# Patient Record
Sex: Male | Born: 1935 | Race: White | Hispanic: No | Marital: Single | Smoking: Former smoker
Health system: Southern US, Community
[De-identification: ages and names within clinical notes are randomized; demographics above are authoritative.]

## PROBLEM LIST (undated history)

## (undated) DIAGNOSIS — E119 Type 2 diabetes mellitus without complications: Secondary | ICD-10-CM

## (undated) DIAGNOSIS — I10 Essential (primary) hypertension: Secondary | ICD-10-CM

## (undated) DIAGNOSIS — M1990 Unspecified osteoarthritis, unspecified site: Secondary | ICD-10-CM

## (undated) HISTORY — PX: COLONOSCOPY: SHX174

## (undated) HISTORY — PX: TONSILLECTOMY: SUR1361

---

## 2005-05-15 ENCOUNTER — Ambulatory Visit (HOSPITAL_COMMUNITY): Admission: RE | Admit: 2005-05-15 | Discharge: 2005-05-15 | Payer: Self-pay | Admitting: Gastroenterology

## 2011-10-23 DIAGNOSIS — E119 Type 2 diabetes mellitus without complications: Secondary | ICD-10-CM | POA: Diagnosis not present

## 2011-12-01 DIAGNOSIS — L03319 Cellulitis of trunk, unspecified: Secondary | ICD-10-CM | POA: Diagnosis not present

## 2011-12-01 DIAGNOSIS — L02219 Cutaneous abscess of trunk, unspecified: Secondary | ICD-10-CM | POA: Diagnosis not present

## 2011-12-12 DIAGNOSIS — E119 Type 2 diabetes mellitus without complications: Secondary | ICD-10-CM | POA: Diagnosis not present

## 2011-12-12 DIAGNOSIS — E7800 Pure hypercholesterolemia, unspecified: Secondary | ICD-10-CM | POA: Diagnosis not present

## 2011-12-12 DIAGNOSIS — Z79899 Other long term (current) drug therapy: Secondary | ICD-10-CM | POA: Diagnosis not present

## 2011-12-14 DIAGNOSIS — E785 Hyperlipidemia, unspecified: Secondary | ICD-10-CM | POA: Diagnosis not present

## 2011-12-14 DIAGNOSIS — M25569 Pain in unspecified knee: Secondary | ICD-10-CM | POA: Diagnosis not present

## 2011-12-14 DIAGNOSIS — E119 Type 2 diabetes mellitus without complications: Secondary | ICD-10-CM | POA: Diagnosis not present

## 2011-12-14 DIAGNOSIS — I10 Essential (primary) hypertension: Secondary | ICD-10-CM | POA: Diagnosis not present

## 2012-02-27 DIAGNOSIS — E119 Type 2 diabetes mellitus without complications: Secondary | ICD-10-CM | POA: Diagnosis not present

## 2012-03-12 DIAGNOSIS — H43819 Vitreous degeneration, unspecified eye: Secondary | ICD-10-CM | POA: Diagnosis not present

## 2012-03-28 DIAGNOSIS — Z23 Encounter for immunization: Secondary | ICD-10-CM | POA: Diagnosis not present

## 2012-06-10 DIAGNOSIS — Z125 Encounter for screening for malignant neoplasm of prostate: Secondary | ICD-10-CM | POA: Diagnosis not present

## 2012-06-10 DIAGNOSIS — E119 Type 2 diabetes mellitus without complications: Secondary | ICD-10-CM | POA: Diagnosis not present

## 2012-06-10 DIAGNOSIS — E785 Hyperlipidemia, unspecified: Secondary | ICD-10-CM | POA: Diagnosis not present

## 2012-06-10 DIAGNOSIS — Z79899 Other long term (current) drug therapy: Secondary | ICD-10-CM | POA: Diagnosis not present

## 2012-06-13 DIAGNOSIS — I10 Essential (primary) hypertension: Secondary | ICD-10-CM | POA: Diagnosis not present

## 2012-06-13 DIAGNOSIS — E785 Hyperlipidemia, unspecified: Secondary | ICD-10-CM | POA: Diagnosis not present

## 2012-06-13 DIAGNOSIS — Z8249 Family history of ischemic heart disease and other diseases of the circulatory system: Secondary | ICD-10-CM | POA: Diagnosis not present

## 2012-06-13 DIAGNOSIS — IMO0001 Reserved for inherently not codable concepts without codable children: Secondary | ICD-10-CM | POA: Diagnosis not present

## 2012-12-10 DIAGNOSIS — E1149 Type 2 diabetes mellitus with other diabetic neurological complication: Secondary | ICD-10-CM | POA: Diagnosis not present

## 2012-12-10 DIAGNOSIS — Z79899 Other long term (current) drug therapy: Secondary | ICD-10-CM | POA: Diagnosis not present

## 2012-12-10 DIAGNOSIS — I10 Essential (primary) hypertension: Secondary | ICD-10-CM | POA: Diagnosis not present

## 2012-12-10 DIAGNOSIS — E785 Hyperlipidemia, unspecified: Secondary | ICD-10-CM | POA: Diagnosis not present

## 2012-12-12 DIAGNOSIS — E785 Hyperlipidemia, unspecified: Secondary | ICD-10-CM | POA: Diagnosis not present

## 2012-12-12 DIAGNOSIS — I10 Essential (primary) hypertension: Secondary | ICD-10-CM | POA: Diagnosis not present

## 2012-12-12 DIAGNOSIS — E1149 Type 2 diabetes mellitus with other diabetic neurological complication: Secondary | ICD-10-CM | POA: Diagnosis not present

## 2012-12-19 DIAGNOSIS — E119 Type 2 diabetes mellitus without complications: Secondary | ICD-10-CM | POA: Diagnosis not present

## 2013-03-17 DIAGNOSIS — Z23 Encounter for immunization: Secondary | ICD-10-CM | POA: Diagnosis not present

## 2013-06-11 DIAGNOSIS — E1149 Type 2 diabetes mellitus with other diabetic neurological complication: Secondary | ICD-10-CM | POA: Diagnosis not present

## 2013-06-11 DIAGNOSIS — Z79899 Other long term (current) drug therapy: Secondary | ICD-10-CM | POA: Diagnosis not present

## 2013-06-11 DIAGNOSIS — R209 Unspecified disturbances of skin sensation: Secondary | ICD-10-CM | POA: Diagnosis not present

## 2013-06-11 DIAGNOSIS — G63 Polyneuropathy in diseases classified elsewhere: Secondary | ICD-10-CM | POA: Diagnosis not present

## 2013-06-11 DIAGNOSIS — E785 Hyperlipidemia, unspecified: Secondary | ICD-10-CM | POA: Diagnosis not present

## 2013-06-13 DIAGNOSIS — I10 Essential (primary) hypertension: Secondary | ICD-10-CM | POA: Diagnosis not present

## 2013-06-13 DIAGNOSIS — E785 Hyperlipidemia, unspecified: Secondary | ICD-10-CM | POA: Diagnosis not present

## 2013-06-13 DIAGNOSIS — E1149 Type 2 diabetes mellitus with other diabetic neurological complication: Secondary | ICD-10-CM | POA: Diagnosis not present

## 2013-11-21 DIAGNOSIS — S40019A Contusion of unspecified shoulder, initial encounter: Secondary | ICD-10-CM | POA: Diagnosis not present

## 2013-11-25 DIAGNOSIS — E119 Type 2 diabetes mellitus without complications: Secondary | ICD-10-CM | POA: Diagnosis not present

## 2013-12-10 DIAGNOSIS — E785 Hyperlipidemia, unspecified: Secondary | ICD-10-CM | POA: Diagnosis not present

## 2013-12-10 DIAGNOSIS — E1149 Type 2 diabetes mellitus with other diabetic neurological complication: Secondary | ICD-10-CM | POA: Diagnosis not present

## 2013-12-12 DIAGNOSIS — E1142 Type 2 diabetes mellitus with diabetic polyneuropathy: Secondary | ICD-10-CM | POA: Diagnosis not present

## 2013-12-12 DIAGNOSIS — E785 Hyperlipidemia, unspecified: Secondary | ICD-10-CM | POA: Diagnosis not present

## 2013-12-12 DIAGNOSIS — M25519 Pain in unspecified shoulder: Secondary | ICD-10-CM | POA: Diagnosis not present

## 2013-12-12 DIAGNOSIS — Z79899 Other long term (current) drug therapy: Secondary | ICD-10-CM | POA: Diagnosis not present

## 2013-12-12 DIAGNOSIS — E1149 Type 2 diabetes mellitus with other diabetic neurological complication: Secondary | ICD-10-CM | POA: Diagnosis not present

## 2013-12-12 DIAGNOSIS — I10 Essential (primary) hypertension: Secondary | ICD-10-CM | POA: Diagnosis not present

## 2014-03-28 DIAGNOSIS — Z23 Encounter for immunization: Secondary | ICD-10-CM | POA: Diagnosis not present

## 2014-06-10 DIAGNOSIS — E785 Hyperlipidemia, unspecified: Secondary | ICD-10-CM | POA: Diagnosis not present

## 2014-06-10 DIAGNOSIS — Z79899 Other long term (current) drug therapy: Secondary | ICD-10-CM | POA: Diagnosis not present

## 2014-06-10 DIAGNOSIS — E1142 Type 2 diabetes mellitus with diabetic polyneuropathy: Secondary | ICD-10-CM | POA: Diagnosis not present

## 2014-06-12 DIAGNOSIS — E785 Hyperlipidemia, unspecified: Secondary | ICD-10-CM | POA: Diagnosis not present

## 2014-06-12 DIAGNOSIS — E1140 Type 2 diabetes mellitus with diabetic neuropathy, unspecified: Secondary | ICD-10-CM | POA: Diagnosis not present

## 2014-06-12 DIAGNOSIS — M25569 Pain in unspecified knee: Secondary | ICD-10-CM | POA: Diagnosis not present

## 2014-06-12 DIAGNOSIS — I10 Essential (primary) hypertension: Secondary | ICD-10-CM | POA: Diagnosis not present

## 2014-08-31 DIAGNOSIS — M1712 Unilateral primary osteoarthritis, left knee: Secondary | ICD-10-CM | POA: Diagnosis not present

## 2014-08-31 DIAGNOSIS — M1612 Unilateral primary osteoarthritis, left hip: Secondary | ICD-10-CM | POA: Diagnosis not present

## 2014-09-28 DIAGNOSIS — M1712 Unilateral primary osteoarthritis, left knee: Secondary | ICD-10-CM | POA: Diagnosis not present

## 2014-09-28 DIAGNOSIS — M1612 Unilateral primary osteoarthritis, left hip: Secondary | ICD-10-CM | POA: Diagnosis not present

## 2014-10-02 DIAGNOSIS — M1612 Unilateral primary osteoarthritis, left hip: Secondary | ICD-10-CM | POA: Diagnosis not present

## 2014-10-13 DIAGNOSIS — M1612 Unilateral primary osteoarthritis, left hip: Secondary | ICD-10-CM | POA: Diagnosis not present

## 2014-10-28 DIAGNOSIS — M1712 Unilateral primary osteoarthritis, left knee: Secondary | ICD-10-CM | POA: Diagnosis not present

## 2014-10-28 DIAGNOSIS — M1612 Unilateral primary osteoarthritis, left hip: Secondary | ICD-10-CM | POA: Diagnosis not present

## 2014-11-11 ENCOUNTER — Other Ambulatory Visit: Payer: Self-pay | Admitting: Orthopaedic Surgery

## 2014-12-09 DIAGNOSIS — Z79899 Other long term (current) drug therapy: Secondary | ICD-10-CM | POA: Diagnosis not present

## 2014-12-09 DIAGNOSIS — E1140 Type 2 diabetes mellitus with diabetic neuropathy, unspecified: Secondary | ICD-10-CM | POA: Diagnosis not present

## 2014-12-09 DIAGNOSIS — E785 Hyperlipidemia, unspecified: Secondary | ICD-10-CM | POA: Diagnosis not present

## 2014-12-10 ENCOUNTER — Encounter (HOSPITAL_COMMUNITY)
Admission: RE | Admit: 2014-12-10 | Discharge: 2014-12-10 | Disposition: A | Discharge status: Home / Self Care | Payer: Medicare Other | Source: Ambulatory Visit | Attending: Orthopaedic Surgery | Admitting: Orthopaedic Surgery

## 2014-12-10 ENCOUNTER — Encounter (HOSPITAL_COMMUNITY): Payer: Self-pay

## 2014-12-10 DIAGNOSIS — Z87891 Personal history of nicotine dependence: Secondary | ICD-10-CM | POA: Insufficient documentation

## 2014-12-10 DIAGNOSIS — E119 Type 2 diabetes mellitus without complications: Secondary | ICD-10-CM | POA: Diagnosis not present

## 2014-12-10 DIAGNOSIS — Z01818 Encounter for other preprocedural examination: Secondary | ICD-10-CM | POA: Diagnosis not present

## 2014-12-10 DIAGNOSIS — Z794 Long term (current) use of insulin: Secondary | ICD-10-CM | POA: Insufficient documentation

## 2014-12-10 HISTORY — DX: Essential (primary) hypertension: I10

## 2014-12-10 HISTORY — DX: Unspecified osteoarthritis, unspecified site: M19.90

## 2014-12-10 HISTORY — DX: Type 2 diabetes mellitus without complications: E11.9

## 2014-12-10 LAB — CBC WITH DIFFERENTIAL/PLATELET
Basophils Absolute: 0 10*3/uL (ref 0.0–0.1)
Basophils Relative: 0 % (ref 0–1)
EOS PCT: 2 % (ref 0–5)
Eosinophils Absolute: 0.1 10*3/uL (ref 0.0–0.7)
HEMATOCRIT: 41.9 % (ref 39.0–52.0)
HEMOGLOBIN: 14.5 g/dL (ref 13.0–17.0)
Lymphocytes Relative: 41 % (ref 12–46)
Lymphs Abs: 2.1 10*3/uL (ref 0.7–4.0)
MCH: 30.3 pg (ref 26.0–34.0)
MCHC: 34.6 g/dL (ref 30.0–36.0)
MCV: 87.5 fL (ref 78.0–100.0)
MONO ABS: 0.6 10*3/uL (ref 0.1–1.0)
Monocytes Relative: 12 % (ref 3–12)
Neutro Abs: 2.3 10*3/uL (ref 1.7–7.7)
Neutrophils Relative %: 45 % (ref 43–77)
Platelets: 172 10*3/uL (ref 150–400)
RBC: 4.79 MIL/uL (ref 4.22–5.81)
RDW: 13.3 % (ref 11.5–15.5)
WBC: 5 10*3/uL (ref 4.0–10.5)

## 2014-12-10 LAB — URINALYSIS, ROUTINE W REFLEX MICROSCOPIC
Bilirubin Urine: NEGATIVE
GLUCOSE, UA: NEGATIVE mg/dL
Hgb urine dipstick: NEGATIVE
KETONES UR: NEGATIVE mg/dL
LEUKOCYTES UA: NEGATIVE
NITRITE: NEGATIVE
Protein, ur: NEGATIVE mg/dL
Specific Gravity, Urine: 1.013 (ref 1.005–1.030)
Urobilinogen, UA: 0.2 mg/dL (ref 0.0–1.0)
pH: 7 (ref 5.0–8.0)

## 2014-12-10 LAB — SURGICAL PCR SCREEN
MRSA, PCR: NEGATIVE
Staphylococcus aureus: NEGATIVE

## 2014-12-10 LAB — TYPE AND SCREEN
ABO/RH(D): O POS
Antibody Screen: NEGATIVE

## 2014-12-10 LAB — PROTIME-INR
INR: 0.97 (ref 0.00–1.49)
PROTHROMBIN TIME: 13.1 s (ref 11.6–15.2)

## 2014-12-10 LAB — APTT: aPTT: 33 seconds (ref 24–37)

## 2014-12-10 LAB — GLUCOSE, CAPILLARY: Glucose-Capillary: 161 mg/dL — ABNORMAL HIGH (ref 65–99)

## 2014-12-10 LAB — ABO/RH: ABO/RH(D): O POS

## 2014-12-10 NOTE — Pre-Procedure Instructions (Signed)
    CLINE GRAVATT  12/10/2014       Your procedure is scheduled on Tuesday, June 28.  Report to [facility] Admitting at 11:00 A.M.   Call this number if you have problems the morning of surgery:  [phone]                For any other questions, please call [phone], Monday - Friday 8 AM - 4 PM.   Remember:  Do not eat food or drink liquids after midnight Monday, June 27.  Take these medicines the morning of surgery with A SIP OF WATER :felodipine (PLENDIL).                On June 21 stop Aspirin and Aleve.   Do not wear jewelry, make-up or nail polish.  Do not wear lotions, powders, or perfumes.  Men may shave face and neck.  Do not bring valuables to the hospital.  [facility] is not responsible for any belongings or valuables.  Contacts, dentures or bridgework may not be worn into surgery.  Leave your suitcase in the car.  After surgery it may be brought to your room.  For patients admitted to the hospital, discharge time will be determined by your treatment team.  Patients discharged the day of surgery will not be allowed to drive home.   Special instructions: Review  [place] - Preparing For Surgery.  Please read over the following fact sheets that you were given. Pain Booklet, Coughing and Deep Breathing, Blood Transfusion Information and Surgical Site Infection Prevention, Incentive Spirometry, How to Manage Your Diabetes Before and After Surgery and What Do I Do About My Diabetes Medications?

## 2014-12-10 NOTE — Progress Notes (Signed)
Leon Morgan has type II diabetes, Dr Cain Saupe manages it. Patient reports that fasting CBGs run 70- 90.  Patient is unsure of A1C results, but had one drawn 12/09/14.  I requested labs and office notes from that date.

## 2014-12-10 NOTE — Pre-Procedure Instructions (Addendum)
    Leon Morgan  12/10/2014       Your procedure is scheduled on Tuesday, June 28.  Report to [facility] Admitting at 11:00 A.M.   Call this number if you have problems the morning of surgery:  [phone]                For any other questions, please call [phone], Monday - Friday 8 AM - 4 PM.   Remember:  Do not eat food or drink liquids after midnight Monday, June 27.  Take these medicines the morning of surgery with A SIP OF WATER :felodipine (PLENDIL).                On June 21 stop Aspirin or Aleve.   Do not wear jewelry, make-up or nail polish.  Do not wear lotions, powders, or perfumes.  Men may shave face and neck.  Do not bring valuables to the hospital.  [facility] is not responsible for any belongings or valuables.  Contacts, dentures or bridgework may not be worn into surgery.  Leave your suitcase in the car.  After surgery it may be brought to your room.  For patients admitted to the hospital, discharge time will be determined by your treatment team.  Patients discharged the day of surgery will not be allowed to drive home.   Special instructions: Review  [place] - Preparing For Surgery.  Please read over the following fact sheets that you were given. Pain Booklet, Coughing and Deep Breathing, Blood Transfusion Information and Surgical Site Infection Prevention, Incentive Spirometry, How to Manage Your Diabetes Before and After Surgery and What Do I Do About My Diabetes Medications?

## 2014-12-10 NOTE — Progress Notes (Signed)
Mr Plese reports that he does know how he is going to get to the hospital or get home.  "Dr Jerl Santos said they would take care of it."  I called and spoke to scheduler for Dr Jerl Santos and informed her that patient is expected to her from her.

## 2014-12-10 NOTE — Progress Notes (Signed)
Leon Morgan denied chest pain or shortness of breath.  Patient does not see a cardiologist and is unaware of having an Echo or Stress Test.

## 2014-12-11 DIAGNOSIS — I10 Essential (primary) hypertension: Secondary | ICD-10-CM | POA: Diagnosis not present

## 2014-12-11 DIAGNOSIS — M1612 Unilateral primary osteoarthritis, left hip: Secondary | ICD-10-CM | POA: Diagnosis not present

## 2014-12-11 DIAGNOSIS — E785 Hyperlipidemia, unspecified: Secondary | ICD-10-CM | POA: Diagnosis not present

## 2014-12-11 DIAGNOSIS — Z23 Encounter for immunization: Secondary | ICD-10-CM | POA: Diagnosis not present

## 2014-12-11 DIAGNOSIS — Z79899 Other long term (current) drug therapy: Secondary | ICD-10-CM | POA: Diagnosis not present

## 2014-12-11 DIAGNOSIS — E1140 Type 2 diabetes mellitus with diabetic neuropathy, unspecified: Secondary | ICD-10-CM | POA: Diagnosis not present

## 2014-12-16 DIAGNOSIS — H18413 Arcus senilis, bilateral: Secondary | ICD-10-CM | POA: Diagnosis not present

## 2014-12-16 DIAGNOSIS — H43812 Vitreous degeneration, left eye: Secondary | ICD-10-CM | POA: Diagnosis not present

## 2014-12-16 DIAGNOSIS — E119 Type 2 diabetes mellitus without complications: Secondary | ICD-10-CM | POA: Diagnosis not present

## 2014-12-21 MED ORDER — LACTATED RINGERS IV SOLN
INTRAVENOUS | Status: DC
  Administered 2014-12-22 (×2): via INTRAVENOUS

## 2014-12-21 MED ORDER — CEFAZOLIN SODIUM-DEXTROSE 2-3 GM-% IV SOLR
2.0000 g | INTRAVENOUS | Status: AC
  Administered 2014-12-22: 2 g via INTRAVENOUS
  Filled 2014-12-21: qty 50

## 2014-12-21 NOTE — Progress Notes (Signed)
Left message with new arrival time of 9:40

## 2014-12-21 NOTE — H&P (Signed)
TOTAL HIP ADMISSION H&P  Patient is admitted for left total hip arthroplasty.  Subjective:  Chief Complaint: left hip pain  HPI: Leon Morgan, 79 y.o. male, has a history of pain and functional disability in the left hip(s) due to arthritis and patient has failed non-surgical conservative treatments for greater than 12 weeks to include NSAID's and/or analgesics, corticosteriod injections, flexibility and strengthening excercises, weight reduction as appropriate and activity modification.  Onset of symptoms was gradual starting 5 years ago with gradually worsening course since that time.The patient noted no past surgery on the left hip(s).  Patient currently rates pain in the left hip at 10 out of 10 with activity. Patient has night pain, worsening of pain with activity and weight bearing, trendelenberg gait, pain that interfers with activities of daily living and crepitus. Patient has evidence of subchondral cysts, subchondral sclerosis, periarticular osteophytes and joint space narrowing by imaging studies. This condition presents safety issues increasing the risk of falls. There is no current active infection.  There are no active problems to display for this patient.  Past Medical History  Diagnosis Date  . Hypertension   . Diabetes mellitus without complication     type II  . Arthritis     Past Surgical History  Procedure Laterality Date  . Tonsillectomy      age 55  . Colonoscopy      no sediation    No prescriptions prior to admission   Not on File  History  Substance Use Topics  . Smoking status: Former Smoker -- 35 years  . Smokeless tobacco: Not on file     Comment: quit in 1982  . Alcohol Use: 0.6 oz/week    1 Glasses of wine per week    No family history on file.   Review of Systems  Musculoskeletal: Positive for joint pain.       Left hip  All other systems reviewed and are negative.   Objective:  Physical Exam  Constitutional: He is oriented to person,  place, and time. He appears well-developed and well-nourished.  HENT:  Head: Normocephalic and atraumatic.  Eyes: Pupils are equal, round, and reactive to light.  Neck: Normal range of motion.  Cardiovascular: Normal rate and regular rhythm.   Respiratory: Effort normal.  GI: Soft.  Musculoskeletal:  Left hip has no rotational motion and all of this is fairly painful.  He has no hip flexion contracture.  His left knee moves fairly well from about 0-100.  He does have some crepitation there but very little joint line pain.  Sensation and motor function are intact distally with palpable pulses in his feet.  He has no palpable lymphadenopathy at the groin.   Neurological: He is alert and oriented to person, place, and time.  Skin: Skin is warm and dry.  Psychiatric: He has a normal mood and affect. His behavior is normal. Judgment and thought content normal.    Vital signs in last 24 hours:    Labs:   There is no height or weight on file to calculate BMI.   Imaging Review Plain radiographs demonstrate severe degenerative joint disease of the left hip(s). The bone quality appears to be good for age and reported activity level.  Assessment/Plan:  End stage primary arthritis, left hip(s)  The patient history, physical examination, clinical judgement of the provider and imaging studies are consistent with end stage degenerative joint disease of the left hip(s) and total hip arthroplasty is deemed medically necessary. The treatment  options including medical management, injection therapy, arthroscopy and arthroplasty were discussed at length. The risks and benefits of total hip arthroplasty were presented and reviewed. The risks due to aseptic loosening, infection, stiffness, dislocation/subluxation,  thromboembolic complications and other imponderables were discussed.  The patient acknowledged the explanation, agreed to proceed with the plan and consent was signed. Patient is being admitted  for inpatient treatment for surgery, pain control, PT, OT, prophylactic antibiotics, VTE prophylaxis, progressive ambulation and ADL's and discharge planning.The patient is planning to be discharged home with home health services

## 2014-12-22 ENCOUNTER — Encounter (HOSPITAL_COMMUNITY)
Admission: RE | Disposition: A | Discharge status: Home Health | Payer: Self-pay | Source: Ambulatory Visit | Attending: Orthopaedic Surgery

## 2014-12-22 ENCOUNTER — Inpatient Hospital Stay (HOSPITAL_COMMUNITY)
Admission: RE | Admit: 2014-12-22 | Discharge: 2014-12-24 | DRG: 470 | Disposition: A | Discharge status: Home Health | Payer: Medicare Other | Source: Ambulatory Visit | Attending: Orthopaedic Surgery | Admitting: Orthopaedic Surgery

## 2014-12-22 ENCOUNTER — Inpatient Hospital Stay (HOSPITAL_COMMUNITY): Payer: Medicare Other | Admitting: Anesthesiology

## 2014-12-22 ENCOUNTER — Inpatient Hospital Stay (HOSPITAL_COMMUNITY): Payer: Medicare Other | Admitting: Emergency Medicine

## 2014-12-22 ENCOUNTER — Inpatient Hospital Stay (HOSPITAL_COMMUNITY): Payer: Medicare Other

## 2014-12-22 ENCOUNTER — Encounter (HOSPITAL_COMMUNITY): Payer: Self-pay | Admitting: Anesthesiology

## 2014-12-22 DIAGNOSIS — I10 Essential (primary) hypertension: Secondary | ICD-10-CM | POA: Diagnosis present

## 2014-12-22 DIAGNOSIS — Z794 Long term (current) use of insulin: Secondary | ICD-10-CM | POA: Diagnosis not present

## 2014-12-22 DIAGNOSIS — Z471 Aftercare following joint replacement surgery: Secondary | ICD-10-CM | POA: Diagnosis not present

## 2014-12-22 DIAGNOSIS — Z96642 Presence of left artificial hip joint: Secondary | ICD-10-CM | POA: Diagnosis not present

## 2014-12-22 DIAGNOSIS — Z79899 Other long term (current) drug therapy: Secondary | ICD-10-CM

## 2014-12-22 DIAGNOSIS — Z419 Encounter for procedure for purposes other than remedying health state, unspecified: Secondary | ICD-10-CM

## 2014-12-22 DIAGNOSIS — Z7982 Long term (current) use of aspirin: Secondary | ICD-10-CM

## 2014-12-22 DIAGNOSIS — E119 Type 2 diabetes mellitus without complications: Secondary | ICD-10-CM

## 2014-12-22 DIAGNOSIS — M1612 Unilateral primary osteoarthritis, left hip: Secondary | ICD-10-CM | POA: Diagnosis not present

## 2014-12-22 DIAGNOSIS — Z87891 Personal history of nicotine dependence: Secondary | ICD-10-CM

## 2014-12-22 DIAGNOSIS — M169 Osteoarthritis of hip, unspecified: Secondary | ICD-10-CM | POA: Diagnosis not present

## 2014-12-22 DIAGNOSIS — M25552 Pain in left hip: Secondary | ICD-10-CM | POA: Diagnosis not present

## 2014-12-22 HISTORY — PX: TOTAL HIP ARTHROPLASTY: SHX124

## 2014-12-22 LAB — GLUCOSE, CAPILLARY
GLUCOSE-CAPILLARY: 241 mg/dL — AB (ref 65–99)
Glucose-Capillary: 81 mg/dL (ref 65–99)
Glucose-Capillary: 49 mg/dL — ABNORMAL LOW (ref 65–99)
Glucose-Capillary: 68 mg/dL (ref 65–99)

## 2014-12-22 SURGERY — ARTHROPLASTY, HIP, TOTAL, ANTERIOR APPROACH
Anesthesia: Spinal | Site: Hip | Laterality: Left

## 2014-12-22 MED ORDER — PROPOFOL 10 MG/ML IV BOLUS
INTRAVENOUS | Status: AC
  Filled 2014-12-22: qty 20

## 2014-12-22 MED ORDER — TRIAMTERENE-HCTZ 37.5-25 MG PO CAPS
1.0000 | ORAL_CAPSULE | Freq: Every day | ORAL | Status: DC
  Filled 2014-12-22: qty 1

## 2014-12-22 MED ORDER — DOCUSATE SODIUM 100 MG PO CAPS
100.0000 mg | ORAL_CAPSULE | Freq: Two times a day (BID) | ORAL | Status: DC
  Administered 2014-12-22 – 2014-12-24 (×4): 100 mg via ORAL
  Filled 2014-12-22 (×4): qty 1

## 2014-12-22 MED ORDER — ACETAMINOPHEN 325 MG PO TABS: 650.0000 mg | ORAL_TABLET | Freq: Four times a day (QID) | ORAL | Status: DC | PRN

## 2014-12-22 MED ORDER — METHOCARBAMOL 500 MG PO TABS
500.0000 mg | ORAL_TABLET | Freq: Four times a day (QID) | ORAL | Status: DC | PRN
  Administered 2014-12-22 – 2014-12-23 (×2): 500 mg via ORAL
  Filled 2014-12-22 (×2): qty 1

## 2014-12-22 MED ORDER — TRIAMTERENE-HCTZ 37.5-25 MG PO TABS
1.0000 | ORAL_TABLET | Freq: Every day | ORAL | Status: DC
  Administered 2014-12-23: 1 via ORAL
  Filled 2014-12-22 (×3): qty 1

## 2014-12-22 MED ORDER — PROMETHAZINE HCL 25 MG/ML IJ SOLN: 6.2500 mg | INTRAMUSCULAR | Status: DC | PRN

## 2014-12-22 MED ORDER — MIDAZOLAM HCL 5 MG/5ML IJ SOLN
INTRAMUSCULAR | Status: DC | PRN
  Administered 2014-12-22: 2 mg via INTRAVENOUS

## 2014-12-22 MED ORDER — TRANEXAMIC ACID 1000 MG/10ML IV SOLN
2000.0000 mg | INTRAVENOUS | Status: DC
  Filled 2014-12-22: qty 20

## 2014-12-22 MED ORDER — METOCLOPRAMIDE HCL 5 MG/ML IJ SOLN: 5.0000 mg | Freq: Three times a day (TID) | INTRAMUSCULAR | Status: DC | PRN

## 2014-12-22 MED ORDER — HYDROMORPHONE HCL 1 MG/ML IJ SOLN: 0.2500 mg | INTRAMUSCULAR | Status: DC | PRN

## 2014-12-22 MED ORDER — INSULIN ASPART 100 UNIT/ML ~~LOC~~ SOLN
0.0000 [IU] | Freq: Three times a day (TID) | SUBCUTANEOUS | Status: DC
  Administered 2014-12-23 (×3): 4 [IU] via SUBCUTANEOUS
  Administered 2014-12-24: 3 [IU] via SUBCUTANEOUS

## 2014-12-22 MED ORDER — BUPIVACAINE IN DEXTROSE 0.75-8.25 % IT SOLN
INTRATHECAL | Status: DC | PRN
  Administered 2014-12-22: 14 mg via INTRATHECAL

## 2014-12-22 MED ORDER — DIPHENHYDRAMINE HCL 12.5 MG/5ML PO ELIX: 12.5000 mg | ORAL_SOLUTION | ORAL | Status: DC | PRN

## 2014-12-22 MED ORDER — MENTHOL 3 MG MT LOZG: 1.0000 | LOZENGE | OROMUCOSAL | Status: DC | PRN

## 2014-12-22 MED ORDER — TRANEXAMIC ACID 1000 MG/10ML IV SOLN
2000.0000 mg | INTRAVENOUS | Status: DC | PRN
  Administered 2014-12-22: 2000 mg via INTRAVENOUS

## 2014-12-22 MED ORDER — PHENOL 1.4 % MT LIQD: 1.0000 | OROMUCOSAL | Status: DC | PRN

## 2014-12-22 MED ORDER — ONDANSETRON HCL 4 MG/2ML IJ SOLN
INTRAMUSCULAR | Status: AC
  Filled 2014-12-22: qty 2

## 2014-12-22 MED ORDER — ONDANSETRON HCL 4 MG/2ML IJ SOLN
INTRAMUSCULAR | Status: DC | PRN
Start: 2014-12-22 — End: 2014-12-22
  Administered 2014-12-22: 4 mg via INTRAVENOUS

## 2014-12-22 MED ORDER — 0.9 % SODIUM CHLORIDE (POUR BTL) OPTIME
TOPICAL | Status: DC | PRN
  Administered 2014-12-22: 1000 mL

## 2014-12-22 MED ORDER — BISACODYL 5 MG PO TBEC: 5.0000 mg | DELAYED_RELEASE_TABLET | Freq: Every day | ORAL | Status: DC | PRN

## 2014-12-22 MED ORDER — METHOCARBAMOL 1000 MG/10ML IJ SOLN
500.0000 mg | Freq: Four times a day (QID) | INTRAVENOUS | Status: DC | PRN
  Filled 2014-12-22: qty 5

## 2014-12-22 MED ORDER — LACTATED RINGERS IV SOLN
INTRAVENOUS | Status: DC
  Administered 2014-12-22: 18:00:00 via INTRAVENOUS

## 2014-12-22 MED ORDER — FOLIC ACID 1 MG PO TABS
0.5000 mg | ORAL_TABLET | Freq: Every evening | ORAL | Status: DC
  Administered 2014-12-22 – 2014-12-23 (×2): 0.5 mg via ORAL
  Filled 2014-12-22 (×2): qty 1

## 2014-12-22 MED ORDER — GLYCOPYRROLATE 0.2 MG/ML IJ SOLN
INTRAMUSCULAR | Status: DC | PRN
Start: 2014-12-22 — End: 2014-12-22
  Administered 2014-12-22: 0.1 mg via INTRAVENOUS
  Administered 2014-12-22: .05 mg via INTRAVENOUS

## 2014-12-22 MED ORDER — DEXMEDETOMIDINE HCL IN NACL 200 MCG/50ML IV SOLN
INTRAVENOUS | Status: DC | PRN
  Administered 2014-12-22: .2 ug/kg/h via INTRAVENOUS

## 2014-12-22 MED ORDER — CEFAZOLIN SODIUM-DEXTROSE 2-3 GM-% IV SOLR
2.0000 g | Freq: Four times a day (QID) | INTRAVENOUS | Status: AC
  Administered 2014-12-22 (×2): 2 g via INTRAVENOUS
  Filled 2014-12-22 (×2): qty 50

## 2014-12-22 MED ORDER — GLYCOPYRROLATE 0.2 MG/ML IJ SOLN
INTRAMUSCULAR | Status: AC
  Filled 2014-12-22: qty 1

## 2014-12-22 MED ORDER — IRBESARTAN 75 MG PO TABS
75.0000 mg | ORAL_TABLET | Freq: Every day | ORAL | Status: DC
  Administered 2014-12-23 – 2014-12-24 (×2): 75 mg via ORAL
  Filled 2014-12-22 (×2): qty 1

## 2014-12-22 MED ORDER — FENTANYL CITRATE (PF) 100 MCG/2ML IJ SOLN
INTRAMUSCULAR | Status: DC | PRN
  Administered 2014-12-22: 100 ug via INTRAVENOUS

## 2014-12-22 MED ORDER — PHENYLEPHRINE HCL 10 MG/ML IJ SOLN
10.0000 mg | INTRAVENOUS | Status: DC | PRN
  Administered 2014-12-22: 30 ug/min via INTRAVENOUS

## 2014-12-22 MED ORDER — ASPIRIN EC 325 MG PO TBEC
325.0000 mg | DELAYED_RELEASE_TABLET | Freq: Two times a day (BID) | ORAL | Status: DC
  Administered 2014-12-23 – 2014-12-24 (×3): 325 mg via ORAL
  Filled 2014-12-22 (×3): qty 1

## 2014-12-22 MED ORDER — DEXTROSE 5 % IV SOLN
INTRAVENOUS | Status: DC | PRN
  Administered 2014-12-22: 12:00:00 via INTRAVENOUS

## 2014-12-22 MED ORDER — INSULIN NPH (HUMAN) (ISOPHANE) 100 UNIT/ML ~~LOC~~ SUSP
33.0000 [IU] | Freq: Two times a day (BID) | SUBCUTANEOUS | Status: DC
  Administered 2014-12-22 – 2014-12-24 (×4): 33 [IU] via SUBCUTANEOUS
  Filled 2014-12-22: qty 10

## 2014-12-22 MED ORDER — ACETAMINOPHEN 650 MG RE SUPP: 650.0000 mg | Freq: Four times a day (QID) | RECTAL | Status: DC | PRN

## 2014-12-22 MED ORDER — HYDROCODONE-ACETAMINOPHEN 5-325 MG PO TABS
1.0000 | ORAL_TABLET | ORAL | Status: DC | PRN
  Administered 2014-12-22 – 2014-12-23 (×3): 2 via ORAL
  Filled 2014-12-22 (×3): qty 2

## 2014-12-22 MED ORDER — ALUM & MAG HYDROXIDE-SIMETH 200-200-20 MG/5ML PO SUSP: 30.0000 mL | ORAL | Status: DC | PRN

## 2014-12-22 MED ORDER — ONDANSETRON HCL 4 MG/2ML IJ SOLN: 4.0000 mg | Freq: Four times a day (QID) | INTRAMUSCULAR | Status: DC | PRN

## 2014-12-22 MED ORDER — FELODIPINE ER 10 MG PO TB24
10.0000 mg | ORAL_TABLET | Freq: Every day | ORAL | Status: DC
  Administered 2014-12-23 – 2014-12-24 (×2): 10 mg via ORAL
  Filled 2014-12-22 (×2): qty 1

## 2014-12-22 MED ORDER — FOLIC ACID 800 MCG PO TABS: 400.0000 ug | ORAL_TABLET | Freq: Every evening | ORAL | Status: DC

## 2014-12-22 MED ORDER — ONDANSETRON HCL 4 MG PO TABS: 4.0000 mg | ORAL_TABLET | Freq: Four times a day (QID) | ORAL | Status: DC | PRN

## 2014-12-22 MED ORDER — FENTANYL CITRATE (PF) 250 MCG/5ML IJ SOLN
INTRAMUSCULAR | Status: AC
  Filled 2014-12-22: qty 5

## 2014-12-22 MED ORDER — CHLORHEXIDINE GLUCONATE 4 % EX LIQD: 60.0000 mL | Freq: Once | CUTANEOUS | Status: DC

## 2014-12-22 MED ORDER — METFORMIN HCL 500 MG PO TABS
500.0000 mg | ORAL_TABLET | Freq: Two times a day (BID) | ORAL | Status: DC
  Administered 2014-12-22 – 2014-12-24 (×4): 500 mg via ORAL
  Filled 2014-12-22 (×4): qty 1

## 2014-12-22 MED ORDER — ATORVASTATIN CALCIUM 40 MG PO TABS
40.0000 mg | ORAL_TABLET | Freq: Every day | ORAL | Status: DC
  Administered 2014-12-22 – 2014-12-23 (×2): 40 mg via ORAL
  Filled 2014-12-22 (×2): qty 1

## 2014-12-22 MED ORDER — METOCLOPRAMIDE HCL 5 MG PO TABS: 5.0000 mg | ORAL_TABLET | Freq: Three times a day (TID) | ORAL | Status: DC | PRN

## 2014-12-22 MED ORDER — HYDROMORPHONE HCL 1 MG/ML IJ SOLN: 0.5000 mg | INTRAMUSCULAR | Status: DC | PRN

## 2014-12-22 MED ORDER — MIDAZOLAM HCL 2 MG/2ML IJ SOLN
INTRAMUSCULAR | Status: AC
  Filled 2014-12-22: qty 2

## 2014-12-22 SURGICAL SUPPLY — 63 items
APL SKNCLS STERI-STRIP NONHPOA (GAUZE/BANDAGES/DRESSINGS) ×1
BENZOIN TINCTURE PRP APPL 2/3 (GAUZE/BANDAGES/DRESSINGS) ×2 IMPLANT
BLADE SAW SGTL 18X1.27X75 (BLADE) ×2 IMPLANT
BLADE SAW SGTL 18X1.27X75MM (BLADE) ×1
BLADE SURG ROTATE 9660 (MISCELLANEOUS) IMPLANT
CAPT HIP TOTAL 2 ×2 IMPLANT
CELLS DAT CNTRL 66122 CELL SVR (MISCELLANEOUS) ×1 IMPLANT
CLOSURE STERI-STRIP 1/2X4 (GAUZE/BANDAGES/DRESSINGS) ×1
CLSR STERI-STRIP ANTIMIC 1/2X4 (GAUZE/BANDAGES/DRESSINGS) ×1 IMPLANT
COVER PERINEAL POST (MISCELLANEOUS) ×3 IMPLANT
COVER SURGICAL LIGHT HANDLE (MISCELLANEOUS) ×3 IMPLANT
DRAPE C-ARM 42X72 X-RAY (DRAPES) ×3 IMPLANT
DRAPE IMP U-DRAPE 54X76 (DRAPES) ×3 IMPLANT
DRAPE STERI IOBAN 125X83 (DRAPES) ×3 IMPLANT
DRAPE U-SHAPE 47X51 STRL (DRAPES) ×9 IMPLANT
DRSG AQUACEL AG ADV 3.5X10 (GAUZE/BANDAGES/DRESSINGS) ×3 IMPLANT
DURAPREP 26ML APPLICATOR (WOUND CARE) ×3 IMPLANT
ELECT BLADE 4.0 EZ CLEAN MEGAD (MISCELLANEOUS)
ELECT CAUTERY BLADE 6.4 (BLADE) ×3 IMPLANT
ELECT REM PT RETURN 9FT ADLT (ELECTROSURGICAL) ×3
ELECTRODE BLDE 4.0 EZ CLN MEGD (MISCELLANEOUS) IMPLANT
ELECTRODE REM PT RTRN 9FT ADLT (ELECTROSURGICAL) ×1 IMPLANT
FACESHIELD STD STERILE (MASK) ×6 IMPLANT
FACESHIELD WRAPAROUND (MASK) ×9 IMPLANT
FACESHIELD WRAPAROUND OR TEAM (MASK) ×2 IMPLANT
GLOVE BIO SURGEON STRL SZ 6.5 (GLOVE) ×1 IMPLANT
GLOVE BIO SURGEON STRL SZ8 (GLOVE) ×15 IMPLANT
GLOVE BIO SURGEONS STRL SZ 6.5 (GLOVE) ×1
GLOVE BIOGEL PI IND STRL 6.5 (GLOVE) IMPLANT
GLOVE BIOGEL PI IND STRL 8 (GLOVE) ×2 IMPLANT
GLOVE BIOGEL PI INDICATOR 6.5 (GLOVE) ×2
GLOVE BIOGEL PI INDICATOR 8 (GLOVE) ×4
GOWN STRL REUS W/ TWL LRG LVL3 (GOWN DISPOSABLE) ×1 IMPLANT
GOWN STRL REUS W/ TWL XL LVL3 (GOWN DISPOSABLE) ×2 IMPLANT
GOWN STRL REUS W/TWL 2XL LVL3 (GOWN DISPOSABLE) ×2 IMPLANT
GOWN STRL REUS W/TWL LRG LVL3 (GOWN DISPOSABLE) ×6
GOWN STRL REUS W/TWL XL LVL3 (GOWN DISPOSABLE) ×6
KIT BASIN OR (CUSTOM PROCEDURE TRAY) ×3 IMPLANT
KIT ROOM TURNOVER OR (KITS) ×3 IMPLANT
LINER BOOT UNIVERSAL DISP (MISCELLANEOUS) ×3 IMPLANT
MANIFOLD NEPTUNE II (INSTRUMENTS) ×3 IMPLANT
NS IRRIG 1000ML POUR BTL (IV SOLUTION) ×3 IMPLANT
PACK TOTAL JOINT (CUSTOM PROCEDURE TRAY) ×3 IMPLANT
PACK UNIVERSAL I (CUSTOM PROCEDURE TRAY) ×3 IMPLANT
PAD ARMBOARD 7.5X6 YLW CONV (MISCELLANEOUS) ×6 IMPLANT
RETRACTOR WND ALEXIS 18 MED (MISCELLANEOUS) ×1 IMPLANT
RTRCTR WOUND ALEXIS 18CM MED (MISCELLANEOUS) ×3
STAPLER VISISTAT 35W (STAPLE) ×3 IMPLANT
SUT ETHIBOND NAB CT1 #1 30IN (SUTURE) ×9 IMPLANT
SUT MNCRL AB 3-0 PS2 18 (SUTURE) ×2 IMPLANT
SUT VIC AB 0 CT1 27 (SUTURE)
SUT VIC AB 0 CT1 27XBRD ANBCTR (SUTURE) IMPLANT
SUT VIC AB 1 CT1 27 (SUTURE) ×3
SUT VIC AB 1 CT1 27XBRD ANBCTR (SUTURE) ×1 IMPLANT
SUT VIC AB 2-0 CT1 27 (SUTURE) ×3
SUT VIC AB 2-0 CT1 TAPERPNT 27 (SUTURE) ×1 IMPLANT
SUT VLOC 180 0 24IN GS25 (SUTURE) ×3 IMPLANT
TOWEL OR 17X24 6PK STRL BLUE (TOWEL DISPOSABLE) ×3 IMPLANT
TOWEL OR 17X26 10 PK STRL BLUE (TOWEL DISPOSABLE) ×6 IMPLANT
TRAY FOLEY CATH 14FR (SET/KITS/TRAYS/PACK) IMPLANT
TUBE CONNECTING 12'X1/4 (SUCTIONS) ×1
TUBE CONNECTING 12X1/4 (SUCTIONS) ×1 IMPLANT
WATER STERILE IRR 1000ML POUR (IV SOLUTION) ×6 IMPLANT

## 2014-12-22 NOTE — Transfer of Care (Signed)
Immediate Anesthesia Transfer of Care Note  Patient: Leon Morgan  Procedure(s) Performed: Procedure(s): TOTAL LEFT ANTERIOR HIP ARTHROPLASTY  (Left)  Patient Location: PACU  Anesthesia Type:Spinal  Level of Consciousness: awake, alert , oriented and patient cooperative  Airway & Oxygen Therapy: Patient Spontanous Breathing and Patient connected to nasal cannula oxygen  Post-op Assessment: Report given to RN, Post -op Vital signs reviewed and stable and sensory at left knee  Post vital signs: Reviewed and stable  Last Vitals:  Filed Vitals:   12/22/14 0936  BP: 175/51  Pulse: 95  Temp: 36.3 C  Resp: 20    Complications: No apparent anesthesia complications

## 2014-12-22 NOTE — Anesthesia Preprocedure Evaluation (Addendum)
Anesthesia Evaluation  Patient identified by MRN, date of birth, ID band Patient awake    Reviewed: Allergy & Precautions, NPO status , Patient's Chart, lab work & pertinent test results  History of Anesthesia Complications Negative for: history of anesthetic complications  Airway Mallampati: II  TM Distance: >3 FB Neck ROM: Full    Dental  (+) Teeth Intact, Dental Advisory Given   Pulmonary former smoker,  breath sounds clear to auscultation        Cardiovascular hypertension, Pt. on medications Rhythm:Regular Rate:Normal     Neuro/Psych    GI/Hepatic negative GI ROS,   Endo/Other  diabetes, Well Controlled, Type 2, Insulin Dependent, Oral Hypoglycemic Agents  Renal/GU negative Renal ROS     Musculoskeletal  (+) Arthritis -,   Abdominal   Peds  Hematology negative hematology ROS (+)   Anesthesia Other Findings Pt seems to have some problem recalling meds taken this morning but states that he did get an injection of insulin.  Reproductive/Obstetrics                           Anesthesia Physical Anesthesia Plan  ASA: III  Anesthesia Plan: Spinal   Post-op Pain Management:    Induction:   Airway Management Planned: Natural Airway and Nasal Cannula  Additional Equipment:   Intra-op Plan:   Post-operative Plan:   Informed Consent: I have reviewed the patients History and Physical, chart, labs and discussed the procedure including the risks, benefits and alternatives for the proposed anesthesia with the patient or authorized representative who has indicated his/her understanding and acceptance.   Dental advisory given  Plan Discussed with: CRNA and Surgeon  Anesthesia Plan Comments:         Anesthesia Quick Evaluation

## 2014-12-22 NOTE — Anesthesia Postprocedure Evaluation (Signed)
  Anesthesia Post-op Note  Patient: Yolande Jollyonald H Ramnath  Procedure(s) Performed: Procedure(s): TOTAL LEFT ANTERIOR HIP ARTHROPLASTY  (Left)  Patient Location: PACU  Anesthesia Type:Spinal  Level of Consciousness: awake and alert   Airway and Oxygen Therapy: Patient Spontanous Breathing  Post-op Pain: none  Post-op Assessment: Post-op Vital signs reviewed and Patient's Cardiovascular Status Stable LLE Motor Response: Purposeful movement, Responds to commands (able to move left foot) LLE Sensation: Numbness RLE Motor Response: Purposeful movement, Responds to commands (able to bend rle) RLE Sensation: Numbness (spinal, RLE numbness > LLE) L Sensory Level: L5-Outer lower leg, top of foot, great toe R Sensory Level: L5-Outer lower leg, top of foot, great toe  Post-op Vital Signs: stable  Last Vitals:  Filed Vitals:   12/22/14 1544  BP:   Pulse: 56  Temp: 36.4 C  Resp: 14    Complications: No apparent anesthesia complications

## 2014-12-22 NOTE — Anesthesia Procedure Notes (Signed)
Spinal Patient location during procedure: OR Start time: 12/22/2014 11:40 AM End time: 12/22/2014 11:48 AM Staffing Anesthesiologist: Jalilah Wiltsie Performed by: anesthesiologist  Preanesthetic Checklist Completed: patient identified, site marked, surgical consent, pre-op evaluation, timeout performed, IV checked and monitors and equipment checked Spinal Block Prep: Betadine Patient monitoring: cardiac monitor, continuous pulse ox and blood pressure Approach: right paramedian Location: L3-4 Needle Needle type: Quincke  Needle gauge: 25 G Needle length: 9 cm Needle insertion depth: 5 cm Assessment Sensory level: T6 Additional Notes Tolerated well

## 2014-12-22 NOTE — Progress Notes (Signed)
Report given to maryann rn as caregiver 

## 2014-12-22 NOTE — Op Note (Signed)
PRE-OP DIAGNOSIS:  LEFT HIP DEGENERATIVE JOINT DISEASE POST-OP DIAGNOSIS: same PROCEDURE:  LEFT TOTAL HIP ARTHROPLASTY ANTERIOR APPROACH ANESTHESIA:  Spinal SURGEON:  Marcene Corning MD ASSISTANT:  Elodia Florence PA-C   INDICATIONS FOR PROCEDURE:  The patient is a 79 y.o. male with a long history of a painful hip.  This has persisted despite multiple conservative measures.  The patient has persisted with pain and dysfunction making rest and activity difficult.  A total hip replacement is offered as surgical treatment.  Informed operative consent was obtained after discussion of possible complications including reaction to anesthesia, infection, neurovascular injury, dislocation, DVT, PE, and death.  The importance of the postoperative rehab program to optimize result was stressed with the patient.  SUMMARY OF FINDINGS AND PROCEDURE:  Under general anesthesia through a anterior approach an the Hana table a left THR was performed.  The patient had severe degenerative change and excellent bone quality.  We used DePuy components to replace the hip and these were size KA12 Corail femur capped with a +1.5 36mm ceramic hip ball.  On the acetabular side we used a size 52 Gription shell with a plus 4 neutral polyethylene liner.  We did use a hole eliminator.  Elodia Florence PA-C assisted throughout and was invaluable to the completion of the case in that he helped position and retract while I performed the procedure.  He also closed simultaneously to help minimize OR time.  I used fluoroscopy throughout the case to check position of components and leg lengths and read all these views myself.  DESCRIPTION OF PROCEDURE:  The patient was taken to the OR suite where general anesthetic was applied.  The patient was then positioned on the Hana table supine.  All bony prominences were appropriately padded.  Prep and drape was then performed in normal sterile fashion.  The patient was given kefzol preoperative antibiotic and an  appropriate time out was performed.  We then took an anterior approach to the left hip.  Dissection was taken through adipose to the tensor fascia lata fascia.  This structure was incised longitudinally and we dissected in the intermuscular interval just medial to this muscle.  Cobra retractors were placed superior and inferior to the femoral neck superficial to the capsule.  A capsular incision was then made and the retractors were placed along the femoral neck.  Xray was brought in to get a good level for the femoral neck cut which was made with an oscillating saw and osteotome.  The femoral head was removed with a corkscrew.  The acetabulum was exposed and some labral tissues were excised. Reaming was taken to the inside wall of the pelvis and sequentially up to 1 mm smaller than the actual component.  A trial of components was done and then the aforementioned acetabular shell was placed in appropriate tilt and anteversion confirmed by fluoroscopy. The liner was placed along with the hole eliminator and attention was turned to the femur.  The leg was brought down and over into adduction and the elevator bar was used to raise the femur up gently in the wound.  The piriformis was released with care taken to preserve the obturator internus attachment and all of the posterior capsule. The femur was reamed and then broached to the appropriate size.  A trial reduction was done and the aforementioned head and neck assembly gave [place] the best stability in extension with external rotation.  Leg lengths were felt to be about equal by fluoroscopic exam.  The trial  components were removed and the wound irrigated.  We then placed the femoral component in appropriate anteversion.  The head was applied to a dry stem neck and the hip again reduced.  It was again stable in the aforementioned position.  The would was irrigated again followed by re-approximation of anterior capsule with ethibond suture. Tensor fascia was repaired  with V-loc suture  followed by subcutaneous closure with #O and #2 undyed vicryl.  Skin was closed with subQ stitch and steristips followed by a sterile dressing.  EBL and IOF can be obtained from anesthesia records.  DISPOSITION:  The patient was extubated in the OR and taken to PACU in stable condition to be admitted to the Orthopedic Surgery for appropriate post-op care to include perioperative antibiotics and DVT prophylaxis.

## 2014-12-22 NOTE — Interval H&P Note (Signed)
OK for surgery PD 

## 2014-12-22 NOTE — Care Management (Signed)
Utilization review completed by Lanore Renderos N. [place] Lasseter, RN BSN [phone] 

## 2014-12-23 ENCOUNTER — Encounter (HOSPITAL_COMMUNITY): Payer: Self-pay | Admitting: Orthopaedic Surgery

## 2014-12-23 LAB — CBC
HCT: 32.8 % — ABNORMAL LOW (ref 39.0–52.0)
Hemoglobin: 11.4 g/dL — ABNORMAL LOW (ref 13.0–17.0)
MCH: 30.5 pg (ref 26.0–34.0)
MCHC: 34.8 g/dL (ref 30.0–36.0)
MCV: 87.7 fL (ref 78.0–100.0)
Platelets: 131 10*3/uL — ABNORMAL LOW (ref 150–400)
RBC: 3.74 MIL/uL — AB (ref 4.22–5.81)
RDW: 13.3 % (ref 11.5–15.5)
WBC: 8 10*3/uL (ref 4.0–10.5)

## 2014-12-23 LAB — GLUCOSE, CAPILLARY
GLUCOSE-CAPILLARY: 189 mg/dL — AB (ref 65–99)
Glucose-Capillary: 199 mg/dL — ABNORMAL HIGH (ref 65–99)
Glucose-Capillary: 151 mg/dL — ABNORMAL HIGH (ref 65–99)
Glucose-Capillary: 187 mg/dL — ABNORMAL HIGH (ref 65–99)

## 2014-12-23 LAB — BASIC METABOLIC PANEL
Anion gap: 9 (ref 5–15)
BUN: 12 mg/dL (ref 6–20)
CO2: 26 mmol/L (ref 22–32)
CREATININE: 1.12 mg/dL (ref 0.61–1.24)
Calcium: 8.3 mg/dL — ABNORMAL LOW (ref 8.9–10.3)
Chloride: 99 mmol/L — ABNORMAL LOW (ref 101–111)
GFR calc Af Amer: >60 mL/min (ref >60)
GFR calc non Af Amer: >60 mL/min (ref >60)
Glucose, Bld: 188 mg/dL — ABNORMAL HIGH (ref 65–99)
Potassium: 3.4 mmol/L — ABNORMAL LOW (ref 3.5–5.1)
Sodium: 134 mmol/L — ABNORMAL LOW (ref 135–145)

## 2014-12-23 NOTE — Progress Notes (Signed)
CBG 187 

## 2014-12-23 NOTE — Progress Notes (Signed)
Subjective: 1 Day Post-Op Procedure(s) (LRB): TOTAL LEFT ANTERIOR HIP ARTHROPLASTY  (Left)  Activity level:  wbat Diet tolerance:  ok Voiding:  ok Patient reports pain as mild and moderate.    Objective: Vital signs in last 24 hours: Temp:  [97.4 F (36.3 C)-99.6 F (37.6 C)] 98.6 F (37 C) (06/29 0558) Pulse Rate:  [47-95] 80 (06/29 0558) Resp:  [12-20] 18 (06/29 0558) BP: (87-175)/(36-64) 147/45 mmHg (06/29 0558) SpO2:  [96 %-100 %] 100 % (06/29 0558) Weight:  [82.736 kg (182 lb 6.4 oz)] 82.736 kg (182 lb 6.4 oz) (06/28 0936)  Labs:  Recent Labs  12/23/14 0516  HGB 11.4*    Recent Labs  12/23/14 0516  WBC 8.0  RBC 3.74*  HCT 32.8*  PLT 131*    Recent Labs  12/23/14 0516  NA 134*  K 3.4*  CL 99*  CO2 26  BUN 12  CREATININE 1.12  GLUCOSE 188*  CALCIUM 8.3*   No results for input(s): LABPT, INR in the last 72 hours.  Physical Exam:  Neurologically intact ABD soft Neurovascular intact Sensation intact distally Intact pulses distally Dorsiflexion/Plantar flexion intact Incision: dressing C/D/I and no drainage No cellulitis present Compartment soft  Assessment/Plan:  1 Day Post-Op Procedure(s) (LRB): TOTAL LEFT ANTERIOR HIP ARTHROPLASTY  (Left) Advance diet Up with therapy D/C IV fluids Plan for discharge tomorrow Discharge home with home health if doing well and cleared by PT. Continue on ASA 325mg  BID x 4 weeks post op. Follow up in office 2 weeks post op.    Leon Morgan, Ginger OrganNDREW PAUL 12/23/2014, 8:04 AM

## 2014-12-23 NOTE — Evaluation (Signed)
Physical Therapy Evaluation Patient Details Name: Leon Morgan MRN: [id number] DOB: 05/20/1936 Today's Date: 12/23/2014   History of Present Illness  79 yo male s/p L THA. PMH: HTN, DMII, arthritis  Clinical Impression  Pt presents with the below listed impairments and will benefit from skilled PT intervention to address these impairments and increase functional independence with mobility. Pt currently at S level for mobility using RW progressing to mod I. Plan for pt to d/c home with intermittent A available. Pt already has made multiple preparations in the home prior to admission for surgery. Pt can continue to work on gait without AD, balance and stairs prior to d/c.    Follow Up Recommendations Home health PT;Supervision - Intermittent    Equipment Recommendations  Rolling walker with 5" wheels    Recommendations for Other Services       Precautions / Restrictions Precautions Precautions: Fall Precaution Comments: Direct Anterior Approach - no hip precautions Restrictions Weight Bearing Restrictions: Yes LLE Weight Bearing: Weight bearing as tolerated      Mobility  Bed Mobility Overal bed mobility: Modified Independent Bed Mobility: Supine to Sit;Sit to Supine Rolling: Supervision Sidelying to sit: Supervision Supine to sit: Modified independent (Device/Increase time) Sit to supine: Modified independent (Device/Increase time)   General bed mobility comments: practied on flat mat table without rails to simulate home and pt able to perform mod I level with extra time. Pt used UE's to help manage LLE at times  Transfers Overall transfer level: Needs assistance Equipment used: Rolling walker (2 wheeled) Transfers: Sit to/from Stand Sit to Stand: Supervision;Modified independent (Device/Increase time)         General transfer comment: Initial S for safety with transfers with RW and progressing to mod I by end of evaluation  Ambulation/Gait Ambulation/Gait  assistance: Supervision Ambulation Distance (Feet): 200 Feet Assistive device: Rolling walker (2 wheeled) Gait Pattern/deviations: Decreased stance time - left;Step-through pattern;Decreased weight shift to left     General Gait Details: cues for normalizing gait pattern (decreased WB to LLE) and upright posture. At times cues for management of RW around obstacles   Stairs Stairs: Yes Stairs assistance: Supervision Stair Management: One rail Right;Two rails;Step to pattern Number of Stairs: 10 General stair comments: practied with both rails and with rail on L going up to simulate home access. Pt performed at S level and initial cues for which foot to lead with  but able to perform either way. Recommended someone at home to assist with management of RW up/down stairs at this time for safety.  Wheelchair Mobility    Modified Rankin (Stroke Patients Only)       Balance Overall balance assessment: Needs assistance Sitting-balance support: No upper extremity supported;Feet supported Sitting balance-Leahy Scale: Good     Standing balance support: Single extremity supported;No upper extremity supported;During functional activity Standing balance-Leahy Scale: Good                               Pertinent Vitals/Pain Pain Assessment: 0-10 Pain Score: 1  Pain Location: L hip Pain Descriptors / Indicators: Sore Pain Intervention(s): Monitored during session;Repositioned;Ice applied    Home Living Family/patient expects to be discharged to:: Private residence Living Arrangements: Alone Available Help at Discharge: Neighbor;Available PRN/intermittently Type of Home: Apartment Home Access: Stairs to enter Entrance Stairs-Rails: Left;Right Entrance Stairs-Number of Steps: 6 up and 9 down Home Layout: One level Home Equipment: None      Prior  Function Level of Independence: Independent               Hand Dominance   Dominant Hand: Right    Extremity/Trunk  Assessment   Upper Extremity Assessment: Defer to OT evaluation           Lower Extremity Assessment: LLE deficits/detail   LLE Deficits / Details: post op pain and weakness but grossly 4/5  Cervical / Trunk Assessment: Normal  Communication   Communication: No difficulties  Cognition Arousal/Alertness: Awake/alert Behavior During Therapy: WFL for tasks assessed/performed Overall Cognitive Status: Within Functional Limits for tasks assessed                      General Comments General comments (skin integrity, edema, etc.): using ice and elevation for edema management. Educated on and handout given for LE HEP.     Exercises Total Joint Exercises Long Arc Quad: AROM;Strengthening;Left;5 reps;Seated      Assessment/Plan    PT Assessment Patient needs continued PT services  PT Diagnosis Abnormality of gait;Generalized weakness;Acute pain   PT Problem List Decreased strength;Decreased range of motion;Decreased activity tolerance;Decreased balance;Decreased mobility;Decreased knowledge of use of DME;Pain  PT Treatment Interventions DME instruction;Gait training;Stair training;Functional mobility training;Therapeutic activities;Therapeutic exercise;Balance training;Neuromuscular re-education;Patient/family education;Modalities   PT Goals (Current goals can be found in the Care Plan section) Acute Rehab PT Goals Patient Stated Goal: go home PT Goal Formulation: With patient Time For Goal Achievement: 12/30/14 Potential to Achieve Goals: Good    Frequency BID   Barriers to discharge   No barrriers.    Co-evaluation               End of Session   Activity Tolerance: Patient tolerated treatment well Patient left: in chair;with call bell/phone within reach Nurse Communication: Mobility status (Ok for pt to move around in room mod I)         Time: 1308-6578: 0913-0933 PT Time Calculation (min) (ACUTE ONLY): 20 min   Charges:   PT Evaluation $Initial PT  Evaluation Tier I: 1 Procedure PT Treatments $Gait Training: 8-22 mins   PT G Codes:        Delorise RoyalsGray, Raeford Brandenburg [place]  Eleyna Brugh B. Runette Scifres, PT, DPT Pager #: [phone]  12/23/2014, 9:45 AM

## 2014-12-23 NOTE — Progress Notes (Signed)
PT Cancellation Note  Patient Details Name: Leon JollyDonald H Morgan MRN: [id number] DOB: 11-22-1935   Cancelled Treatment:    Reason Eval/Treat Not Completed: Patient declined, no reason specified (awaiting visitors) Attempted to see patient again this AM for his BID treatment, but declined due to awaiting visitors. Will follow up as able. All needs in reach.  Karolee StampsGray, Jaylinn Hellenbrand Darrol PokeBrescia  Elide Stalzer B. Jaye Polidori, PT, DPT Pager #: [phone]  12/23/2014, 11:14 AM

## 2014-12-23 NOTE — Progress Notes (Signed)
Occupational Therapy Evaluation Patient Details Name: Leon Morgan MRN: [id number] DOB: 1935-07-13 Today's Date: 12/23/2014    History of Present Illness 79 yo male s/p L THA. PMH: HTN, DMII, arthritis   Clinical Impression   Patient presenting with decreased strength and decreased independence with ADLs & IADLs post L THA.  Patient independent PTA. Patient currently functioning at an overall supervision>min assist level. Patient will benefit from acute OT to increase overall independence in the areas of ADLs, functional mobility, and overall safety in order to safely discharge home alone with recommended HHOT.     Follow Up Recommendations  Home health OT;Supervision - Intermittent    Equipment Recommendations  3 in 1 bedside comode    Recommendations for Other Services  None at this time   Precautions / Restrictions Precautions Precautions: Fall Restrictions Weight Bearing Restrictions: Yes LLE Weight Bearing: Weight bearing as tolerated      Mobility Bed Mobility Overal bed mobility: Needs Assistance Bed Mobility: Rolling;Sidelying to Sit Rolling: Supervision Sidelying to sit: Supervision       General bed mobility comments: HOB down and no use of rails, supervision for safety during this OT eval  Transfers Overall transfer level: Needs assistance Equipment used: Rolling walker (2 wheeled) Transfers: Sit to/from Stand Sit to Stand: Supervision         General transfer comment: Supervision for safety and cues for hand placement, sequencing using RW    Balance Overall balance assessment: Needs assistance Sitting-balance support: No upper extremity supported;Feet supported Sitting balance-Leahy Scale: Good     Standing balance support: Bilateral upper extremity supported;During functional activity Standing balance-Leahy Scale: Good    ADL Overall ADL's : Needs assistance/impaired   General ADL Comments: Pt unable to reach LLE for LB ADLs, encouraged pt  to work on reaching LLE as a HEP to be able to complete LB ADLs more independently. Pt able to perform sit<>stand transfers and toilet transfer with close supervision using RW. Educated patient on WBAT and reiterated importance of listening to body if he has increased pain to stop doing whatever he is doing. Will follow patient acutely and recommending HHOT due to patient lives alone.     Pertinent Vitals/Pain Pain Assessment: 0-10 Pain Score: 1  Pain Location: left hip->knee Pain Descriptors / Indicators: Aching Pain Intervention(s): Monitored during session;RN gave pain meds during session     Hand Dominance Right   Extremity/Trunk Assessment Upper Extremity Assessment Upper Extremity Assessment: Overall WFL for tasks assessed   Lower Extremity Assessment Lower Extremity Assessment: Defer to PT evaluation   Cervical / Trunk Assessment Cervical / Trunk Assessment: Normal   Communication Communication Communication: No difficulties   Cognition Arousal/Alertness: Awake/alert Behavior During Therapy: WFL for tasks assessed/performed Overall Cognitive Status: Within Functional Limits for tasks assessed              Home Living Family/patient expects to be discharged to:: Private residence Living Arrangements: Alone Available Help at Discharge: Neighbor;Available PRN/intermittently Type of Home: Apartment Home Access: Stairs to enter Entrance Stairs-Number of Steps: 6 up and 9 down Entrance Stairs-Rails: Left;Right (going up, rail on left - going down, rail on right) Home Layout: One level     Bathroom Shower/Tub: Tub/shower unit;Curtain   Bathroom Toilet: Standard     Home Equipment: None   Prior Functioning/Environment Level of Independence: Independent     OT Diagnosis: Generalized weakness;Acute pain   OT Problem List: Decreased strength;Decreased range of motion;Decreased activity tolerance;Decreased safety awareness;Decreased knowledge of use of  DME or  AE;Pain   OT Treatment/Interventions: Self-care/ADL training;Therapeutic exercise;Energy conservation;DME and/or AE instruction;Therapeutic activities;Patient/family education;Balance training    OT Goals(Current goals can be found in the care plan section) Acute Rehab OT Goals Patient Stated Goal: go home OT Goal Formulation: With patient Time For Goal Achievement: 12/30/14 Potential to Achieve Goals: Good ADL Goals Pt Will Perform Grooming: with modified independence;standing Pt Will Perform Upper Body Bathing: Independently;sitting Pt Will Perform Lower Body Bathing: with modified independence;sit to/from stand Pt Will Perform Upper Body Dressing: Independently;sitting Pt Will Perform Lower Body Dressing: with modified independence;sit to/from stand Pt Will Transfer to Toilet: with modified independence;ambulating;bedside commode Pt Will Perform Toileting - Clothing Manipulation and hygiene: with modified independence;sit to/from stand Pt Will Perform Tub/Shower Transfer: Tub transfer;3 in 1;rolling walker;ambulating;with modified independence  OT Frequency: Min 2X/week   Barriers to D/C: Decreased caregiver support   End of Session Equipment Utilized During Treatment: Agricultural consultantolling walker Nurse Communication: Mobility status;Other (comment) (recommendation for pt to walk <> BR with RW and supervision)  Activity Tolerance: Patient tolerated treatment well Patient left: in chair;with call bell/phone within reach   Time: [id number] OT Time Calculation (min): 29 min Charges:  OT General Charges $OT Visit: 1 Procedure OT Evaluation $Initial OT Evaluation Tier I: 1 Procedure OT Treatments $Self Care/Home Management : 8-22 mins  Bless Lisenby , MS, OTR/L, CLT Pager: [phone]  12/23/2014, 9:07 AM

## 2014-12-23 NOTE — Progress Notes (Signed)
Physical Therapy Treatment Patient Details Name: Leon Morgan MRN: [id number] DOB: Dec 29, 1935 Today's Date: 12/23/2014    History of Present Illness 79 yo male s/p L THA. PMH: HTN, DMII, arthritis    PT Comments    Patient is making good progress with PT.  From a mobility standpoint anticipate patient will be ready for DC home tomorrow.  Pt ambulated safely w/ cane this session and reports he feels comfortable with this but would still like to have RW for home.  Mod I sit<>stand, supervision ambulating.  Pt will benefit from continued skilled PT services to increase functional independence and safety.     Follow Up Recommendations  Home health PT;Supervision - Intermittent     Equipment Recommendations  Rolling walker with 5" wheels    Recommendations for Other Services       Precautions / Restrictions Precautions Precautions: Fall Precaution Comments: Direct Anterior Approach - no hip precautions Restrictions Weight Bearing Restrictions: Yes LLE Weight Bearing: Weight bearing as tolerated    Mobility  Bed Mobility               General bed mobility comments: in recliner  Transfers Overall transfer level: Modified independent Equipment used: Rolling walker (2 wheeled) Transfers: Sit to/from Stand Sit to Stand: Modified independent (Device/Increase time)         General transfer comment: Increased time but good carryover from previous session for technique  Ambulation/Gait Ambulation/Gait assistance: Supervision Ambulation Distance (Feet): 600 Feet Assistive device: Rolling walker (2 wheeled);Straight cane Gait Pattern/deviations: Step-through pattern;Antalgic;Decreased stance time - left;Decreased weight shift to left   Gait velocity interpretation: Below normal speed for age/gender General Gait Details: 200 ft w/ RW w/ good technique.  400 ft w/ SPC: educated pt on proper use of SPC and supervision for safety.  Pt feels comfortable w/ use of SPC and  reports he will likely use one once home but would like to have RW as well.   Stairs            Wheelchair Mobility    Modified Rankin (Stroke Patients Only)       Balance Overall balance assessment: Needs assistance Sitting-balance support: No upper extremity supported;Feet supported Sitting balance-Leahy Scale: Good     Standing balance support: Single extremity supported;During functional activity Standing balance-Leahy Scale: Fair                      Cognition Arousal/Alertness: Awake/alert Behavior During Therapy: WFL for tasks assessed/performed Overall Cognitive Status: Within Functional Limits for tasks assessed                      Exercises Total Joint Exercises Hip ABduction/ADduction: AROM;Left;10 reps;Standing Long Arc Quad: AROM;Left;10 reps;Seated Knee Flexion: AROM;Right;10 reps;Standing Standing Hip Extension: AROM;Left;10 reps;Standing    General Comments        Pertinent Vitals/Pain Pain Assessment: 0-10 Pain Score: 1  Pain Location: L hip Pain Descriptors / Indicators: Sore Pain Intervention(s): Limited activity within patient's tolerance;Monitored during session;Repositioned    Home Living                      Prior Function            PT Goals (current goals can now be found in the care plan section) Acute Rehab PT Goals Patient Stated Goal: go home PT Goal Formulation: With patient Time For Goal Achievement: 12/30/14 Potential to Achieve Goals: Good Progress towards PT goals:  Progressing toward goals    Frequency  7X/week    PT Plan Frequency needs to be updated    Co-evaluation             End of Session Equipment Utilized During Treatment: Gait belt Activity Tolerance: Patient tolerated treatment well Patient left: in chair;with call bell/phone within reach     Time: 1447-1506 PT Time Calculation (min) (ACUTE ONLY): 19 min  Charges:  $Gait Training: 8-22 mins                     G Codes:      Michail Jewels PT, [place] [phone] Pager: [phone] 12/23/2014, 4:16 PM

## 2014-12-24 LAB — CBC
HEMATOCRIT: 31.9 % — AB (ref 39.0–52.0)
Hemoglobin: 11.1 g/dL — ABNORMAL LOW (ref 13.0–17.0)
MCH: 30.3 pg (ref 26.0–34.0)
MCHC: 34.8 g/dL (ref 30.0–36.0)
MCV: 87.2 fL (ref 78.0–100.0)
Platelets: 134 10*3/uL — ABNORMAL LOW (ref 150–400)
RBC: 3.66 MIL/uL — ABNORMAL LOW (ref 4.22–5.81)
RDW: 13.6 % (ref 11.5–15.5)
WBC: 9.8 10*3/uL (ref 4.0–10.5)

## 2014-12-24 LAB — GLUCOSE, CAPILLARY
GLUCOSE-CAPILLARY: 146 mg/dL — AB (ref 65–99)
Glucose-Capillary: 123 mg/dL — ABNORMAL HIGH (ref 65–99)

## 2014-12-24 MED ORDER — HYDROCODONE-ACETAMINOPHEN 5-325 MG PO TABS: 1.0000 | ORAL_TABLET | ORAL | Status: AC | PRN

## 2014-12-24 MED ORDER — METHOCARBAMOL 500 MG PO TABS: 500.0000 mg | ORAL_TABLET | Freq: Four times a day (QID) | ORAL | Status: AC | PRN

## 2014-12-24 MED ORDER — ASPIRIN 325 MG PO TBEC: 325.0000 mg | DELAYED_RELEASE_TABLET | Freq: Two times a day (BID) | ORAL | Status: AC

## 2014-12-24 NOTE — Progress Notes (Signed)
Physical Therapy Treatment Patient Details Name: Leon Morgan MRN: [id number] DOB: 1936-04-09 Today's Date: 12/24/2014    History of Present Illness 79 yo male s/p L THA. PMH: HTN, DMII, arthritis    PT Comments    Saw patient again to work on standing HEP and patient also wanting to work on ambulation more. Still does not want to use cane for majority of ambulation. Planning to DC later today  Follow Up Recommendations  Home health PT;Supervision - Intermittent     Equipment Recommendations  Rolling walker with 5" wheels    Recommendations for Other Services       Precautions / Restrictions Precautions Precautions: Fall Precaution Comments: Direct Anterior Approach - no hip precautions Restrictions Weight Bearing Restrictions: Yes LLE Weight Bearing: Weight bearing as tolerated    Mobility  Bed Mobility               General bed mobility comments: Patient up in and recliner before and after session  Transfers Overall transfer level: Modified independent Equipment used: Rolling walker (2 wheeled) Transfers: Sit to/from Stand Sit to Stand: Modified independent (Device/Increase time)         General transfer comment: Increased time but good carryover from previous session for technique. Occasional distant supervision for safety during transfers (cues needed to use RW for all mobility at this time).   Ambulation/Gait Ambulation/Gait assistance: Modified independent (Device/Increase time) Ambulation Distance (Feet): 1000 Feet         General Gait Details: Used RW for comfort. Good and safe technique   Stairs            Wheelchair Mobility    Modified Rankin (Stroke Patients Only)       Balance Overall balance assessment: Needs assistance Sitting-balance support: Feet supported;No upper extremity supported Sitting balance-Leahy Scale: Good     Standing balance support: Bilateral upper extremity supported;During functional  activity Standing balance-Leahy Scale: Good                      Cognition Arousal/Alertness: Awake/alert Behavior During Therapy: WFL for tasks assessed/performed Overall Cognitive Status: Within Functional Limits for tasks assessed                      Exercises Total Joint Exercises Hip ABduction/ADduction: AROM;Left;10 reps;Standing Knee Flexion: AROM;Left;10 reps;Standing Standing Hip Extension: AROM;Left;10 reps;Standing    General Comments        Pertinent Vitals/Pain Pain Assessment: No/denies pain Pain Score: 6  Pain Location: left hip Pain Descriptors / Indicators: Tightness Pain Intervention(s): Monitored during session    Home Living                      Prior Function            PT Goals (current goals can now be found in the care plan section) Progress towards PT goals: Progressing toward goals    Frequency  7X/week    PT Plan Current plan remains appropriate    Co-evaluation             End of Session   Activity Tolerance: Patient tolerated treatment well Patient left: in chair;with call bell/phone within reach     Time: 9562-1308 PT Time Calculation (min) (ACUTE ONLY): 24 min  Charges:  $Gait Training: 8-22 mins $Therapeutic Exercise: 8-22 mins                    G Codes:  Fredrich BirksRobinette, Victora Irby Elizabeth 12/24/2014, 1:31 PM  12/24/2014 Fredrich Birksobinette, Rohin Krejci Elizabeth PTA [phone] pager [phone] office

## 2014-12-24 NOTE — Progress Notes (Signed)
Occupational Therapy Treatment Patient Details Name: Leon JollyDonald H Willadsen MRN: [id number] DOB: 29-Jul-1935 Today's Date: 12/24/2014    History of present illness 79 yo male s/p L THA. PMH: HTN, DMII, arthritis   OT comments  Patient making good progress towards OT goals, continue plan of care for now. Pt overall supervision->mod I for functional mobility/transfers, but requires min->mod assist for LB ADLs due to increased tightness in bilateral hips (especially left hip).    Follow Up Recommendations  Home health OT;Supervision - Intermittent    Equipment Recommendations  3 in 1 bedside comode    Recommendations for Other Services  None at this time  Precautions / Restrictions Precautions Precautions: Fall Precaution Comments: Direct Anterior Approach - no hip precautions Restrictions Weight Bearing Restrictions: Yes LLE Weight Bearing: Weight bearing as tolerated    Mobility Bed Mobility Overal bed mobility: Modified Independent - Per PT note General bed mobility comments: Pt found seated in recliner upon OT entering/exiting room  Transfers Overall transfer level: Modified independent Equipment used: Rolling walker (2 wheeled) Transfers: Sit to/from Stand Sit to Stand: Modified independent (Device/Increase time) General transfer comment: Increased time but good carryover from previous session for technique. Occasional distant supervision for safety during transfers (cues needed to use RW for all mobility at this time).     Balance Overall balance assessment: Needs assistance Sitting-balance support: Feet supported;No upper extremity supported Sitting balance-Leahy Scale: Good     Standing balance support: Bilateral upper extremity supported;During functional activity Standing balance-Leahy Scale: Good   ADL Overall ADL's : Needs assistance/impaired General ADL Comments: Pt continues to demonstrate difficulty with reaching BLEs for LB ADLs. Continued to encourage patient to  reach LEs as a HEP. Educated pt on AE (reacher, LH sponge, LH shoe horn, sock aid) to help increase independnece with LB ADLs. Pt ambulated <> therapy gym and worked on LB ADLs and tub[facility]ower [facility], educated pt on safe and effective technique for getting in/out of tub/shower. Pt distant supervision for functional mobility and transfers. Pt will require intermittent supervision post d/c and continue to recommend HHOT due to pt lives alone and can benefit for ADLs and functional mobility.      Cognition   Behavior During Therapy: WFL for tasks assessed/performed Overall Cognitive Status: Within Functional Limits for tasks assessed                Pertinent Vitals/ Pain       Pain Assessment: 0-10 Pain Score: 6  Pain Location: left hip Pain Descriptors / Indicators: Tightness Pain Intervention(s): Monitored during session   Frequency Min 2X/week     Progress Toward Goals  OT Goals(current goals can now befound in the care plan section)  Progress towards OT goals: Progressing toward goals     Plan Discharge plan remains appropriate    End of Session Equipment Utilized During Treatment: Rolling walker   Activity Tolerance Patient tolerated treatment well   Patient Left in chair;with call bell/phone within reach    Time: 1116-1134 OT Time Calculation (min): 18 min  Charges: OT General Charges $OT Visit: 1 Procedure OT Treatments $Self Care/Home Management : 8-22 mins  Margarita Croke , MS, OTR/L, CLT Pager: [phone]  12/24/2014, 11:45 AM

## 2014-12-24 NOTE — Discharge Summary (Signed)
Patient ID: Leon Morgan MRN: [id number] DOB/AGE: December 26, 1935 79 y.o.  Admit date: 12/22/2014 Discharge date: 12/24/2014  Admission Diagnoses:  Principal Problem:   Primary osteoarthritis of left hip Active Problems:   Diabetes   Discharge Diagnoses:  Same  Past Medical History  Diagnosis Date  . Hypertension   . Diabetes mellitus without complication     type II  . Arthritis     Surgeries: Procedure(s): TOTAL LEFT ANTERIOR HIP ARTHROPLASTY  on 12/22/2014   Consultants:    Discharged Condition: Improved  Hospital Course: Leon JollyDonald H Brys is an 79 y.o. male who was admitted 12/22/2014 for operative treatment ofPrimary osteoarthritis of left hip. Patient has severe unremitting pain that affects sleep, daily activities, and work/hobbies. After pre-op clearance the patient was taken to the operating room on 12/22/2014 and underwent  Procedure(s): TOTAL LEFT ANTERIOR HIP ARTHROPLASTY .    Patient was given perioperative antibiotics: Anti-infectives    Start     Dose/Rate Route Frequency Ordered Stop   12/22/14 1730  ceFAZolin (ANCEF) IVPB 2 g/50 mL premix     2 g 100 mL/hr over 30 Minutes Intravenous Every 6 hours 12/22/14 1614 12/22/14 2314   12/22/14 1100  ceFAZolin (ANCEF) IVPB 2 g/50 mL premix     2 g 100 mL/hr over 30 Minutes Intravenous To ShortStay Surgical 12/21/14 1304 12/22/14 1150       Patient was given sequential compression devices, early ambulation, and chemoprophylaxis to prevent DVT.  Patient benefited maximally from hospital stay and there were no complications.    Recent vital signs: Patient Vitals for the past 24 hrs:  BP Temp Temp src Pulse SpO2  12/24/14 0623 (!) 132/50 mmHg 98.6 F (37 C) Oral 75 98 %  12/23/14 2048 (!) 148/40 mmHg 98.1 F (36.7 C) Oral 99 97 %     Recent laboratory studies:  Recent Labs  12/23/14 0516 12/24/14 0352  WBC 8.0 9.8  HGB 11.4* 11.1*  HCT 32.8* 31.9*  PLT 131* 134*  NA 134*  --   K 3.4*  --   CL 99*  --    CO2 26  --   BUN 12  --   CREATININE 1.12  --   GLUCOSE 188*  --   CALCIUM 8.3*  --      Discharge Medications:     Medication List    STOP taking these medications        aspirin 81 MG chewable tablet  Replaced by:  aspirin 325 MG EC tablet      TAKE these medications        aspirin 325 MG EC tablet  Take 1 tablet (325 mg total) by mouth 2 (two) times daily after a meal.     calcium-vitamin D 500-200 MG-UNIT per tablet  Commonly known as:  OSCAL WITH D  Take 1 tablet by mouth 2 (two) times daily.     felodipine 10 MG 24 hr tablet  Commonly known as:  PLENDIL  Take 10 mg by mouth daily.     Fish Oil 1000 MG Caps  Take 1,000 mg by mouth 2 (two) times daily.     folic acid 800 MCG tablet  Commonly known as:  FOLVITE  Take 400 mcg by mouth every evening.     HYDROcodone-acetaminophen 5-325 MG per tablet  Commonly known as:  NORCO/VICODIN  Take 1-2 tablets by mouth every 4 (four) hours as needed (breakthrough pain).     insulin NPH Human 100 UNIT/ML injection  Commonly known as:  HUMULIN N,NOVOLIN N  Inject 33 Units into the skin 2 (two) times daily.     metFORMIN 500 MG tablet  Commonly known as:  GLUCOPHAGE  Take 500 mg by mouth 2 (two) times daily with a meal.     methocarbamol 500 MG tablet  Commonly known as:  ROBAXIN  Take 1 tablet (500 mg total) by mouth every 6 (six) hours as needed for muscle spasms.     simvastatin 80 MG tablet  Commonly known as:  ZOCOR  Take 80 mg by mouth daily.     telmisartan 20 MG tablet  Commonly known as:  MICARDIS  Take 20 mg by mouth daily.     triamterene-hydrochlorothiazide 37.5-25 MG per capsule  Commonly known as:  DYAZIDE  Take 1 capsule by mouth daily.     vitamin C 500 MG tablet  Commonly known as:  ASCORBIC ACID  Take 500 mg by mouth daily.     vitamin E 400 UNIT capsule  Take 400 Units by mouth daily.        Diagnostic Studies: Dg Chest 2 View  12/10/2014   EXAM: CHEST  2 VIEW  COMPARISON:  None.   FINDINGS: The heart size and mediastinal contours are within normal limits. Both lungs are clear. The visualized skeletal structures are unremarkable.  IMPRESSION: No active cardiopulmonary disease.   Electronically Signed   By: Elige Ko   On: 12/10/2014 13:40   Dg Hip Operative Unilat With Pelvis Left  12/22/2014   CLINICAL DATA:  LEFT hip replacement, anterior approach  EXAM: OPERATIVE LEFT HIP (WITH PELVIS IF PERFORMED) 2 VIEWS  TECHNIQUE: Fluoroscopic spot image(s) were submitted for interpretation post-operatively.  FLUOROSCOPY TIME:  Radiation Exposure Index (as provided by the fluoroscopic device): Not provided  If the device does not provide the exposure index:  Fluoroscopy Time:  0 minutes 38 seconds  Number of Acquired Images:  2  COMPARISON:  None  FINDINGS: Components of LEFT hip prosthesis identified in expected positions.  No acute fracture dislocation.  IMPRESSION: LEFT hip prosthesis without acute complication.   Electronically Signed   By: Ulyses Southward M.D.   On: 12/22/2014 14:28    Disposition: Final discharge disposition not confirmed      Discharge Instructions    Call MD / Call 911    Complete by:  As directed   If you experience chest pain or shortness of breath, CALL 911 and be transported to the hospital emergency room.  If you develope a fever above 101 F, pus (white drainage) or increased drainage or redness at the wound, or calf pain, call your surgeon's office.     Constipation Prevention    Complete by:  As directed   Drink plenty of fluids.  Prune juice may be helpful.  You may use a stool softener, such as Colace (over the counter) 100 mg twice a day.  Use MiraLax (over the counter) for constipation as needed.     Diet - low sodium heart healthy    Complete by:  As directed      Discharge instructions    Complete by:  As directed   INSTRUCTIONS AFTER JOINT REPLACEMENT   Remove items at home which could result in a fall. This includes throw rugs or furniture in  walking pathways ICE to the affected joint every three hours while awake for 30 minutes at a time, for at least the first 3-5 days, and then as needed for pain  and swelling.  Continue to use ice for pain and swelling. You may notice swelling that will progress down to the foot and ankle.  This is normal after surgery.  Elevate your leg when you are not up walking on it.   Continue to use the breathing machine you got in the hospital (incentive spirometer) which will help keep your temperature down.  It is common for your temperature to cycle up and down following surgery, especially at night when you are not up moving around and exerting yourself.  The breathing machine keeps your lungs expanded and your temperature down.   DIET:  As you were doing prior to hospitalization, we recommend a well-balanced diet.  DRESSING / WOUND CARE / SHOWERING  You may shower 3 days after surgery, but keep the wounds dry during showering.  You may use an occlusive plastic wrap (Press'n Seal for example), NO SOAKING/SUBMERGING IN THE BATHTUB.  If the bandage gets wet, change with a clean dry gauze.  If the incision gets wet, pat the wound dry with a clean towel.  ACTIVITY  Increase activity slowly as tolerated, but follow the weight bearing instructions below.   No driving for 6 weeks or until further direction given by your physician.  You cannot drive while taking narcotics.  No lifting or carrying greater than 10 lbs. until further directed by your surgeon. Avoid periods of inactivity such as sitting longer than an hour when not asleep. This helps prevent blood clots.  You may return to work once you are authorized by your doctor.     WEIGHT BEARING   Weight bearing as tolerated with assist device (walker, cane, etc) as directed, use it as long as suggested by your surgeon or therapist, typically at least 4-6 weeks.   EXERCISES  Results after joint replacement surgery are often greatly improved when you  follow the exercise, range of motion and muscle strengthening exercises prescribed by your doctor. Safety measures are also important to protect the joint from further injury. Any time any of these exercises cause you to have increased pain or swelling, decrease what you are doing until you are comfortable again and then slowly increase them. If you have problems or questions, call your caregiver or physical therapist for advice.   Rehabilitation is important following a joint replacement. After just a few days of immobilization, the muscles of the leg can become weakened and shrink (atrophy).  These exercises are designed to build up the tone and strength of the thigh and leg muscles and to improve motion. Often times heat used for twenty to thirty minutes before working out will loosen up your tissues and help with improving the range of motion but do not use heat for the first two weeks following surgery (sometimes heat can increase post-operative swelling).   These exercises can be done on a training (exercise) mat, on the floor, on a table or on a bed. Use whatever works the best and is most comfortable for you.    Use music or television while you are exercising so that the exercises are a pleasant break in your day. This will make your life better with the exercises acting as a break in your routine that you can look forward to.   Perform all exercises about fifteen times, three times per day or as directed.  You should exercise both the operative leg and the other leg as well.   Exercises include:   Quad Sets - Tighten up the muscle  on the front of the thigh (Quad) and hold for 5-10 seconds.   Straight Leg Raises - With your knee straight (if you were given a brace, keep it on), lift the leg to 60 degrees, hold for 3 seconds, and slowly lower the leg.  Perform this exercise against resistance later as your leg gets stronger.  Leg Slides: Lying on your back, slowly slide your foot toward your  buttocks, bending your knee up off the floor (only go as far as is comfortable). Then slowly slide your foot back down until your leg is flat on the floor again.  Angel Wings: Lying on your back spread your legs to the side as far apart as you can without causing discomfort.  Hamstring Strength:  Lying on your back, push your heel against the floor with your leg straight by tightening up the muscles of your buttocks.  Repeat, but this time bend your knee to a comfortable angle, and push your heel against the floor.  You may put a pillow under the heel to make it more comfortable if necessary.   A rehabilitation program following joint replacement surgery can speed recovery and prevent re-injury in the future due to weakened muscles. Contact your doctor or a physical therapist for more information on knee rehabilitation.    CONSTIPATION  Constipation is defined medically as fewer than three stools per week and severe constipation as less than one stool per week.  Even if you have a regular bowel pattern at home, your normal regimen is likely to be disrupted due to multiple reasons following surgery.  Combination of anesthesia, postoperative narcotics, change in appetite and fluid intake all can affect your bowels.   YOU MUST use at least one of the following options; they are listed in order of increasing strength to get the job done.  They are all available over the counter, and you may need to use some, POSSIBLY even all of these options:    Drink plenty of fluids (prune juice may be helpful) and high fiber foods Colace 100 mg by mouth twice a day  Senokot for constipation as directed and as needed Dulcolax (bisacodyl), take with full glass of water  Miralax (polyethylene glycol) once or twice a day as needed.  If you have tried all these things and are unable to have a bowel movement in the first 3-4 days after surgery call either your surgeon or your primary doctor.    If you experience loose  stools or diarrhea, hold the medications until you stool forms back up.  If your symptoms do not get better within 1 week or if they get worse, check with your doctor.  If you experience "the worst abdominal pain ever" or develop nausea or vomiting, please contact the office immediately for further recommendations for treatment.   ITCHING:  If you experience itching with your medications, try taking only a single pain pill, or even half a pain pill at a time.  You can also use Benadryl over the counter for itching or also to help with sleep.   TED HOSE STOCKINGS:  Use stockings on both legs until for at least 2 weeks or as directed by physician office. They may be removed at night for sleeping.  MEDICATIONS:  See your medication summary on the "After Visit Summary" that nursing will review with you.  You may have some home medications which will be placed on hold until you complete the course of blood thinner medication.  It is  important for you to complete the blood thinner medication as prescribed.  PRECAUTIONS:  If you experience chest pain or shortness of breath - call 911 immediately for transfer to the hospital emergency department.   If you develop a fever greater that 101 F, purulent drainage from wound, increased redness or drainage from wound, foul odor from the wound/dressing, or calf pain - CONTACT YOUR SURGEON.                                                   FOLLOW-UP APPOINTMENTS:  If you do not already have a post-op appointment, please call the office for an appointment to be seen by your surgeon.  Guidelines for how soon to be seen are listed in your "After Visit Summary", but are typically between 1-4 weeks after surgery.  OTHER INSTRUCTIONS:   Knee Replacement:  Do not place pillow under knee, focus on keeping the knee straight while resting. CPM instructions: 0-90 degrees, 2 hours in the morning, 2 hours in the afternoon, and 2 hours in the evening. Place foam block, curve side  up under heel at all times except when in CPM or when walking.  DO NOT modify, tear, cut, or change the foam block in any way.  MAKE SURE YOU:  Understand these instructions.  Get help right away if you are not doing well or get worse.    Thank you for letting us be a part of your medical care team.  It is a privilege we respect greatly.  We hope these instructions will help you stay on track for a fast and full recovery!     Increase activity slowly as tolerated    Complete by:  As directed            Follow-up Information    Follow up with Velna Ochs, MD. Schedule an appointment as soon as possible for a visit in 2 weeks.   Specialty:  Orthopedic Surgery   Contact information:   [address] [place] [address] [phone]       Follow up with [facility].   Why:  They will contact you to schedule home therapy visits.   Contact information:   [address] [place] [address] [phone]        Signed: Drema Halon 12/24/2014, 2:44 PM

## 2014-12-24 NOTE — Care Management (Signed)
Important Message  Patient Details  Name: Leon JollyDonald H Merlo MRN: [id number] Date of Birth: 07-03-1935   Medicare Important Message Given:  Yes-second notification given    Orson AloeMegan P Lajoyce Tamura 12/24/2014, 10:34 AM

## 2014-12-24 NOTE — Progress Notes (Signed)
Physical Therapy Treatment Patient Details Name: Leon JollyDonald H Morgan MRN: [id number] DOB: 04-03-1936 Today's Date: 12/24/2014    History of Present Illness 79 yo male s/p L THA. PMH: HTN, DMII, arthritis    PT Comments    Patient continues to be motivated and to make good progress. Patient safe to D/C from a mobility standpoint based on progression towards goals set on PT eval.    Follow Up Recommendations        Equipment Recommendations  Rolling walker with 5" wheels    Recommendations for Other Services       Precautions / Restrictions Precautions Precautions: Fall Precaution Comments: Direct Anterior Approach - no hip precautions Restrictions LLE Weight Bearing: Weight bearing as tolerated    Mobility  Bed Mobility Overal bed mobility: Modified Independent                Transfers Overall transfer level: Modified independent                  Ambulation/Gait Ambulation/Gait assistance: Modified independent (Device/Increase time) Ambulation Distance (Feet): 900 Feet Assistive device: Rolling walker (2 wheeled) Gait Pattern/deviations: Step-through pattern     General Gait Details: Used RW for comfort. Good and safe technique   Stairs   Stairs assistance: Supervision Stair Management: Step to pattern;Forwards;One rail Left Number of Stairs: 10 General stair comments: Patient demo'd good technique  Wheelchair Mobility    Modified Rankin (Stroke Patients Only)       Balance                                    Cognition Arousal/Alertness: Awake/alert Behavior During Therapy: WFL for tasks assessed/performed Overall Cognitive Status: Within Functional Limits for tasks assessed                      Exercises      General Comments        Pertinent Vitals/Pain Pain Score: 1  Pain Location: L hip Pain Descriptors / Indicators: Sore Pain Intervention(s): Monitored during session    Home Living                       Prior Function            PT Goals (current goals can now be found in the care plan section) Progress towards PT goals: Progressing toward goals    Frequency  7X/week    PT Plan Current plan remains appropriate    Co-evaluation             End of Session   Activity Tolerance: Patient tolerated treatment well Patient left: in chair;with call bell/phone within reach     Time: [id number] PT Time Calculation (min) (ACUTE ONLY): 24 min  Charges:  $Gait Training: 23-37 mins                    G Codes:      Fredrich BirksRobinette, Paolina Karwowski Elizabeth 12/24/2014, 8:15 AM 12/24/2014 Fredrich Birksobinette, Ryott Rafferty Elizabeth PTA [phone] pager [phone] office

## 2014-12-24 NOTE — Progress Notes (Signed)
CBG 146 

## 2014-12-26 DIAGNOSIS — Z96642 Presence of left artificial hip joint: Secondary | ICD-10-CM | POA: Diagnosis not present

## 2014-12-26 DIAGNOSIS — Z794 Long term (current) use of insulin: Secondary | ICD-10-CM | POA: Diagnosis not present

## 2014-12-26 DIAGNOSIS — Z7982 Long term (current) use of aspirin: Secondary | ICD-10-CM | POA: Diagnosis not present

## 2014-12-26 DIAGNOSIS — Z471 Aftercare following joint replacement surgery: Secondary | ICD-10-CM | POA: Diagnosis not present

## 2014-12-26 DIAGNOSIS — I10 Essential (primary) hypertension: Secondary | ICD-10-CM | POA: Diagnosis not present

## 2014-12-26 DIAGNOSIS — E119 Type 2 diabetes mellitus without complications: Secondary | ICD-10-CM | POA: Diagnosis not present

## 2014-12-30 DIAGNOSIS — E119 Type 2 diabetes mellitus without complications: Secondary | ICD-10-CM | POA: Diagnosis not present

## 2014-12-30 DIAGNOSIS — Z471 Aftercare following joint replacement surgery: Secondary | ICD-10-CM | POA: Diagnosis not present

## 2014-12-30 DIAGNOSIS — Z7982 Long term (current) use of aspirin: Secondary | ICD-10-CM | POA: Diagnosis not present

## 2014-12-30 DIAGNOSIS — Z96642 Presence of left artificial hip joint: Secondary | ICD-10-CM | POA: Diagnosis not present

## 2014-12-30 DIAGNOSIS — I10 Essential (primary) hypertension: Secondary | ICD-10-CM | POA: Diagnosis not present

## 2014-12-30 DIAGNOSIS — Z794 Long term (current) use of insulin: Secondary | ICD-10-CM | POA: Diagnosis not present

## 2015-01-01 DIAGNOSIS — Z794 Long term (current) use of insulin: Secondary | ICD-10-CM | POA: Diagnosis not present

## 2015-01-01 DIAGNOSIS — Z471 Aftercare following joint replacement surgery: Secondary | ICD-10-CM | POA: Diagnosis not present

## 2015-01-01 DIAGNOSIS — E119 Type 2 diabetes mellitus without complications: Secondary | ICD-10-CM | POA: Diagnosis not present

## 2015-01-01 DIAGNOSIS — Z96642 Presence of left artificial hip joint: Secondary | ICD-10-CM | POA: Diagnosis not present

## 2015-01-01 DIAGNOSIS — Z7982 Long term (current) use of aspirin: Secondary | ICD-10-CM | POA: Diagnosis not present

## 2015-01-01 DIAGNOSIS — I10 Essential (primary) hypertension: Secondary | ICD-10-CM | POA: Diagnosis not present

## 2015-01-04 DIAGNOSIS — Z96642 Presence of left artificial hip joint: Secondary | ICD-10-CM | POA: Diagnosis not present

## 2015-01-05 DIAGNOSIS — Z7982 Long term (current) use of aspirin: Secondary | ICD-10-CM | POA: Diagnosis not present

## 2015-01-05 DIAGNOSIS — Z794 Long term (current) use of insulin: Secondary | ICD-10-CM | POA: Diagnosis not present

## 2015-01-05 DIAGNOSIS — I10 Essential (primary) hypertension: Secondary | ICD-10-CM | POA: Diagnosis not present

## 2015-01-05 DIAGNOSIS — Z96642 Presence of left artificial hip joint: Secondary | ICD-10-CM | POA: Diagnosis not present

## 2015-01-05 DIAGNOSIS — Z471 Aftercare following joint replacement surgery: Secondary | ICD-10-CM | POA: Diagnosis not present

## 2015-01-05 DIAGNOSIS — E119 Type 2 diabetes mellitus without complications: Secondary | ICD-10-CM | POA: Diagnosis not present

## 2015-01-13 ENCOUNTER — Encounter (HOSPITAL_COMMUNITY): Payer: Self-pay | Admitting: Emergency Medicine

## 2015-01-13 ENCOUNTER — Emergency Department (HOSPITAL_COMMUNITY)
Admission: EM | Admit: 2015-01-13 | Discharge: 2015-01-13 | Disposition: A | Discharge status: Home / Self Care | Payer: No Typology Code available for payment source | Attending: Emergency Medicine | Admitting: Emergency Medicine

## 2015-01-13 DIAGNOSIS — Z7982 Long term (current) use of aspirin: Secondary | ICD-10-CM | POA: Diagnosis not present

## 2015-01-13 DIAGNOSIS — M1990 Unspecified osteoarthritis, unspecified site: Secondary | ICD-10-CM | POA: Insufficient documentation

## 2015-01-13 DIAGNOSIS — Z794 Long term (current) use of insulin: Secondary | ICD-10-CM | POA: Diagnosis not present

## 2015-01-13 DIAGNOSIS — E11649 Type 2 diabetes mellitus with hypoglycemia without coma: Secondary | ICD-10-CM | POA: Diagnosis not present

## 2015-01-13 DIAGNOSIS — Y998 Other external cause status: Secondary | ICD-10-CM | POA: Diagnosis not present

## 2015-01-13 DIAGNOSIS — E162 Hypoglycemia, unspecified: Secondary | ICD-10-CM

## 2015-01-13 DIAGNOSIS — Z87891 Personal history of nicotine dependence: Secondary | ICD-10-CM | POA: Insufficient documentation

## 2015-01-13 DIAGNOSIS — Z79899 Other long term (current) drug therapy: Secondary | ICD-10-CM | POA: Diagnosis not present

## 2015-01-13 DIAGNOSIS — S50812A Abrasion of left forearm, initial encounter: Secondary | ICD-10-CM | POA: Insufficient documentation

## 2015-01-13 DIAGNOSIS — Y92410 Unspecified street and highway as the place of occurrence of the external cause: Secondary | ICD-10-CM | POA: Diagnosis not present

## 2015-01-13 DIAGNOSIS — Y9389 Activity, other specified: Secondary | ICD-10-CM | POA: Insufficient documentation

## 2015-01-13 DIAGNOSIS — I10 Essential (primary) hypertension: Secondary | ICD-10-CM | POA: Diagnosis not present

## 2015-01-13 LAB — CBG MONITORING, ED
GLUCOSE-CAPILLARY: 141 mg/dL — AB (ref 65–99)
Glucose-Capillary: 154 mg/dL — ABNORMAL HIGH (ref 65–99)

## 2015-01-13 LAB — I-STAT CHEM 8, ED
BUN: 20 mg/dL (ref 6–20)
CALCIUM ION: 1.3 mmol/L (ref 1.13–1.30)
CHLORIDE: 101 mmol/L (ref 101–111)
CREATININE: 1.1 mg/dL (ref 0.61–1.24)
Glucose, Bld: 145 mg/dL — ABNORMAL HIGH (ref 65–99)
HEMATOCRIT: 37 % — AB (ref 39.0–52.0)
HEMOGLOBIN: 12.6 g/dL — AB (ref 13.0–17.0)
POTASSIUM: 3.2 mmol/L — AB (ref 3.5–5.1)
SODIUM: 140 mmol/L (ref 135–145)
TCO2: 25 mmol/L (ref 0–100)

## 2015-01-13 LAB — I-STAT TROPONIN, ED: Troponin i, poc: 0 ng/mL (ref 0.00–0.08)

## 2015-01-13 LAB — TROPONIN I: Troponin I: <0.03 ng/mL (ref <0.031)

## 2015-01-13 MED ORDER — POTASSIUM CHLORIDE CRYS ER 20 MEQ PO TBCR
40.0000 meq | EXTENDED_RELEASE_TABLET | Freq: Once | ORAL | Status: AC
  Administered 2015-01-13: 40 meq via ORAL
  Filled 2015-01-13: qty 2
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
## 2015-01-13 NOTE — ED Notes (Signed)
Bed: WA24 Expected date:  Expected time:  Means of arrival:  Comments: EMS/hypoglycemia 

## 2015-01-13 NOTE — Discharge Instructions (Signed)
Hypoglycemia °Hypoglycemia occurs when the glucose in your blood is too low. Glucose is a type of sugar that is your body's main energy source. Hormones, such as insulin and glucagon, control the level of glucose in the blood. Insulin lowers blood glucose and glucagon increases blood glucose. Having too much insulin in your blood stream, or not eating enough food containing sugar, can result in hypoglycemia. Hypoglycemia can happen to people with or without diabetes. It can develop quickly and can be a medical emergency.  °CAUSES  °· Missing or delaying meals. °· Not eating enough carbohydrates at meals. °· Taking too much diabetes medicine. °· Not timing your oral diabetes medicine or insulin doses with meals, snacks, and exercise. °· Nausea and vomiting. °· Certain medicines. °· Severe illnesses, such as hepatitis, kidney disorders, and certain eating disorders. °· Increased activity or exercise without eating something extra or adjusting medicines. °· Drinking too much alcohol. °· A nerve disorder that affects body functions like your heart rate, blood pressure, and digestion (autonomic neuropathy). °· A condition where the stomach muscles do not function properly (gastroparesis). Therefore, medicines and food may not absorb properly. °· Rarely, a tumor of the pancreas can produce too much insulin. °SYMPTOMS  °· Hunger. °· Sweating (diaphoresis). °· Change in body temperature. °· Shakiness. °· Headache. °· Anxiety. °· Lightheadedness. °· Irritability. °· Difficulty concentrating. °· Dry mouth. °· Tingling or numbness in the hands or feet. °· Restless sleep or sleep disturbances. °· Altered speech and coordination. °· Change in mental status. °· Seizures or prolonged convulsions. °· Combativeness. °· Drowsiness (lethargic). °· Weakness. °· Increased heart rate or palpitations. °· Confusion. °· Pale, gray skin color. °· Blurred or double vision. °· Fainting. °DIAGNOSIS  °A physical exam and medical history will be  performed. Your caregiver may make a diagnosis based on your symptoms. Blood tests and other lab tests may be performed to confirm a diagnosis. Once the diagnosis is made, your caregiver will see if your signs and symptoms go away once your blood glucose is raised.  °TREATMENT  °Usually, you can easily treat your hypoglycemia when you notice symptoms. °· Check your blood glucose. If it is less than 70 mg/dl, take one of the following:   °¨ 3-4 glucose tablets.   °¨ ½ cup juice.   °¨ ½ cup regular soda.   °¨ 1 cup skim milk.   °¨ ½-1 tube of glucose gel.   °¨ 5-6 hard candies.   °· Avoid high-fat drinks or food that may delay a rise in blood glucose levels. °· Do not take more than the recommended amount of sugary foods, drinks, gel, or tablets. Doing so will cause your blood glucose to go too high.   °· Wait 10-15 minutes and recheck your blood glucose. If it is still less than 70 mg/dl or below your target range, repeat treatment.   °· Eat a snack if it is more than 1 hour until your next meal.   °There may be a time when your blood glucose may go so low that you are unable to treat yourself at home when you start to notice symptoms. You may need someone to help you. You may even faint or be unable to swallow. If you cannot treat yourself, someone will need to bring you to the hospital.  °HOME CARE INSTRUCTIONS °· If you have diabetes, follow your diabetes management plan by: °¨ Taking your medicines as directed. °¨ Following your exercise plan. °¨ Following your meal plan. Do not skip meals. Eat on time. °¨ Testing your blood   glucose regularly. Check your blood glucose before and after exercise. If you exercise longer or different than usual, be sure to check blood glucose more frequently. °¨ Wearing your medical alert jewelry that says you have diabetes. °· Identify the cause of your hypoglycemia. Then, develop ways to prevent the recurrence of hypoglycemia. °· Do not take a hot bath or shower right after an  insulin shot. °· Always carry treatment with you. Glucose tablets are the easiest to carry. °· If you are going to drink alcohol, drink it only with meals. °· Tell friends or family members ways to keep you safe during a seizure. This may include removing hard or sharp objects from the area or turning you on your side. °· Maintain a healthy weight. °SEEK MEDICAL CARE IF:  °· You are having problems keeping your blood glucose in your target range. °· You are having frequent episodes of hypoglycemia. °· You feel you might be having side effects from your medicines. °· You are not sure why your blood glucose is dropping so low. °· You notice a change in vision or a new problem with your vision. °SEEK IMMEDIATE MEDICAL CARE IF:  °· Confusion develops. °· A change in mental status occurs. °· The inability to swallow develops. °· Fainting occurs. °Document Released: 06/12/2005 Document Revised: 06/17/2013 Document Reviewed: 10/09/2011 °[employer]® Patient Information ©2015 [employer]. This information is not intended to replace advice given to you by your health care provider. Make sure you discuss any questions you have with your health care provider. ° °
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
## 2015-01-18 DIAGNOSIS — Z96642 Presence of left artificial hip joint: Secondary | ICD-10-CM | POA: Diagnosis not present

## 2015-01-25 DIAGNOSIS — E11649 Type 2 diabetes mellitus with hypoglycemia without coma: Secondary | ICD-10-CM | POA: Diagnosis not present

## 2015-01-25 DIAGNOSIS — E876 Hypokalemia: Secondary | ICD-10-CM | POA: Diagnosis not present

## 2015-02-15 DIAGNOSIS — Z9889 Other specified postprocedural states: Secondary | ICD-10-CM | POA: Diagnosis not present

## 2015-03-15 DIAGNOSIS — Z96642 Presence of left artificial hip joint: Secondary | ICD-10-CM | POA: Diagnosis not present

## 2015-03-15 DIAGNOSIS — Z471 Aftercare following joint replacement surgery: Secondary | ICD-10-CM | POA: Diagnosis not present

## 2015-03-18 DIAGNOSIS — Z23 Encounter for immunization: Secondary | ICD-10-CM | POA: Diagnosis not present

## 2015-05-28 DIAGNOSIS — M25552 Pain in left hip: Secondary | ICD-10-CM | POA: Diagnosis not present

## 2015-06-09 DIAGNOSIS — Z794 Long term (current) use of insulin: Secondary | ICD-10-CM | POA: Diagnosis not present

## 2015-06-09 DIAGNOSIS — Z7984 Long term (current) use of oral hypoglycemic drugs: Secondary | ICD-10-CM | POA: Diagnosis not present

## 2015-06-09 DIAGNOSIS — E1140 Type 2 diabetes mellitus with diabetic neuropathy, unspecified: Secondary | ICD-10-CM | POA: Diagnosis not present

## 2015-06-09 DIAGNOSIS — Z79899 Other long term (current) drug therapy: Secondary | ICD-10-CM | POA: Diagnosis not present

## 2015-06-09 DIAGNOSIS — E785 Hyperlipidemia, unspecified: Secondary | ICD-10-CM | POA: Diagnosis not present

## 2015-06-15 DIAGNOSIS — E785 Hyperlipidemia, unspecified: Secondary | ICD-10-CM | POA: Diagnosis not present

## 2015-06-15 DIAGNOSIS — I10 Essential (primary) hypertension: Secondary | ICD-10-CM | POA: Diagnosis not present

## 2015-06-15 DIAGNOSIS — Z794 Long term (current) use of insulin: Secondary | ICD-10-CM | POA: Diagnosis not present

## 2015-06-15 DIAGNOSIS — M1990 Unspecified osteoarthritis, unspecified site: Secondary | ICD-10-CM | POA: Diagnosis not present

## 2015-06-15 DIAGNOSIS — E1140 Type 2 diabetes mellitus with diabetic neuropathy, unspecified: Secondary | ICD-10-CM | POA: Diagnosis not present

## 2015-06-15 DIAGNOSIS — Z7984 Long term (current) use of oral hypoglycemic drugs: Secondary | ICD-10-CM | POA: Diagnosis not present

## 2015-12-10 DIAGNOSIS — E1165 Type 2 diabetes mellitus with hyperglycemia: Secondary | ICD-10-CM | POA: Diagnosis not present

## 2015-12-10 DIAGNOSIS — E785 Hyperlipidemia, unspecified: Secondary | ICD-10-CM | POA: Diagnosis not present

## 2015-12-10 DIAGNOSIS — E1140 Type 2 diabetes mellitus with diabetic neuropathy, unspecified: Secondary | ICD-10-CM | POA: Diagnosis not present

## 2015-12-10 DIAGNOSIS — Z79899 Other long term (current) drug therapy: Secondary | ICD-10-CM | POA: Diagnosis not present

## 2015-12-10 DIAGNOSIS — Z125 Encounter for screening for malignant neoplasm of prostate: Secondary | ICD-10-CM | POA: Diagnosis not present

## 2015-12-10 DIAGNOSIS — Z7984 Long term (current) use of oral hypoglycemic drugs: Secondary | ICD-10-CM | POA: Diagnosis not present

## 2015-12-10 DIAGNOSIS — Z794 Long term (current) use of insulin: Secondary | ICD-10-CM | POA: Diagnosis not present

## 2015-12-10 DIAGNOSIS — M1990 Unspecified osteoarthritis, unspecified site: Secondary | ICD-10-CM | POA: Diagnosis not present

## 2015-12-14 DIAGNOSIS — M1990 Unspecified osteoarthritis, unspecified site: Secondary | ICD-10-CM | POA: Diagnosis not present

## 2015-12-14 DIAGNOSIS — E785 Hyperlipidemia, unspecified: Secondary | ICD-10-CM | POA: Diagnosis not present

## 2015-12-14 DIAGNOSIS — Z794 Long term (current) use of insulin: Secondary | ICD-10-CM | POA: Diagnosis not present

## 2015-12-14 DIAGNOSIS — I10 Essential (primary) hypertension: Secondary | ICD-10-CM | POA: Diagnosis not present

## 2015-12-14 DIAGNOSIS — Z7984 Long term (current) use of oral hypoglycemic drugs: Secondary | ICD-10-CM | POA: Diagnosis not present

## 2015-12-14 DIAGNOSIS — E1140 Type 2 diabetes mellitus with diabetic neuropathy, unspecified: Secondary | ICD-10-CM | POA: Diagnosis not present

## 2015-12-16 DIAGNOSIS — H35372 Puckering of macula, left eye: Secondary | ICD-10-CM | POA: Diagnosis not present

## 2016-03-16 DIAGNOSIS — Z0000 Encounter for general adult medical examination without abnormal findings: Secondary | ICD-10-CM | POA: Diagnosis not present

## 2016-03-16 DIAGNOSIS — Z23 Encounter for immunization: Secondary | ICD-10-CM | POA: Diagnosis not present

## 2016-03-31 IMAGING — RF DG HIP (WITH PELVIS) OPERATIVE*L*
1 series · 2 of 2 positions shown · non-contrast
Comparison: None

CLINICAL DATA: LEFT hip replacement, anterior approach

EXAM:
OPERATIVE LEFT HIP (WITH PELVIS IF PERFORMED) 2 VIEWS
TECHNIQUE: Fluoroscopic spot image(s) were submitted for interpretation
post-operatively.
FLUOROSCOPY TIME:  Radiation Exposure Index (as provided by the
fluoroscopic device): Not provided
If the device does not provide the exposure index:
Fluoroscopy Time:  0 minutes 38 seconds
Number of Acquired Images:  2

[Series 1: run · 2 of 2 slices shown]
[im 1/2]
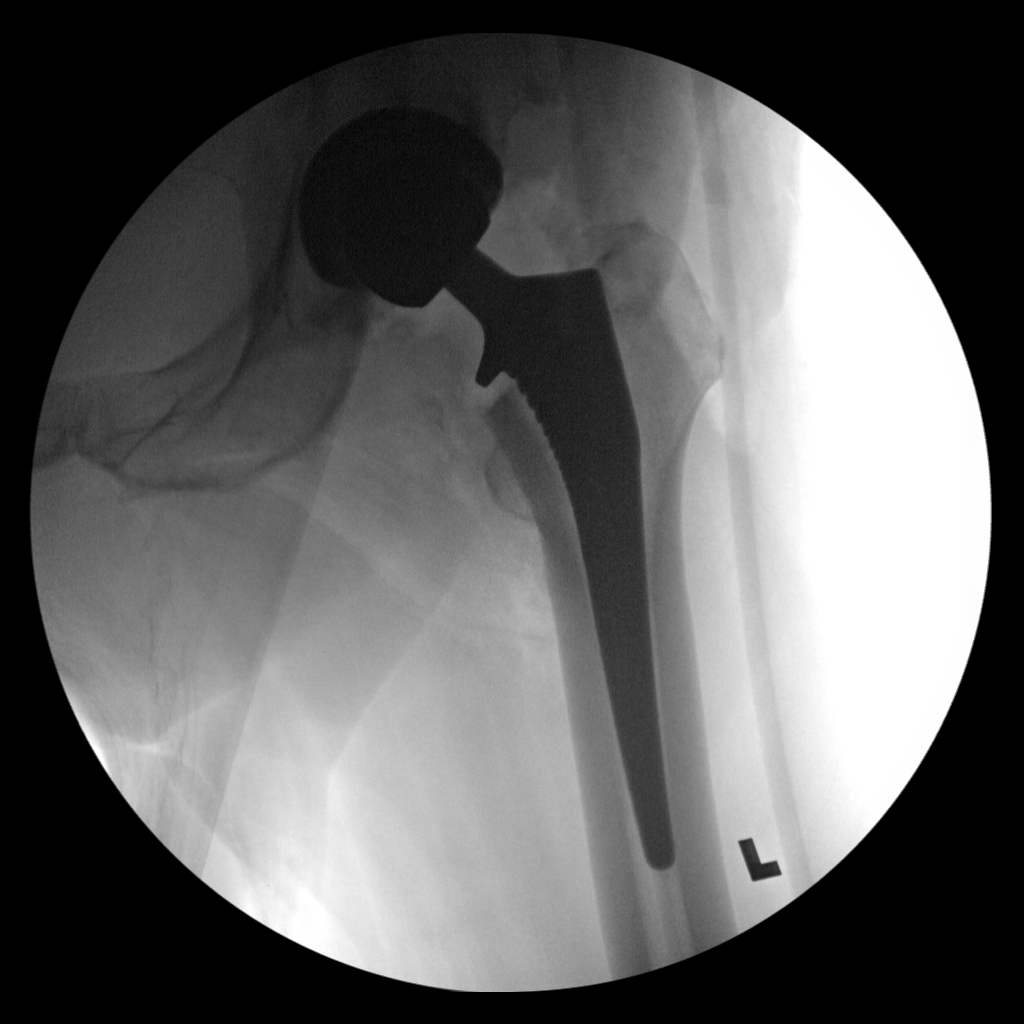
[im 2/2]
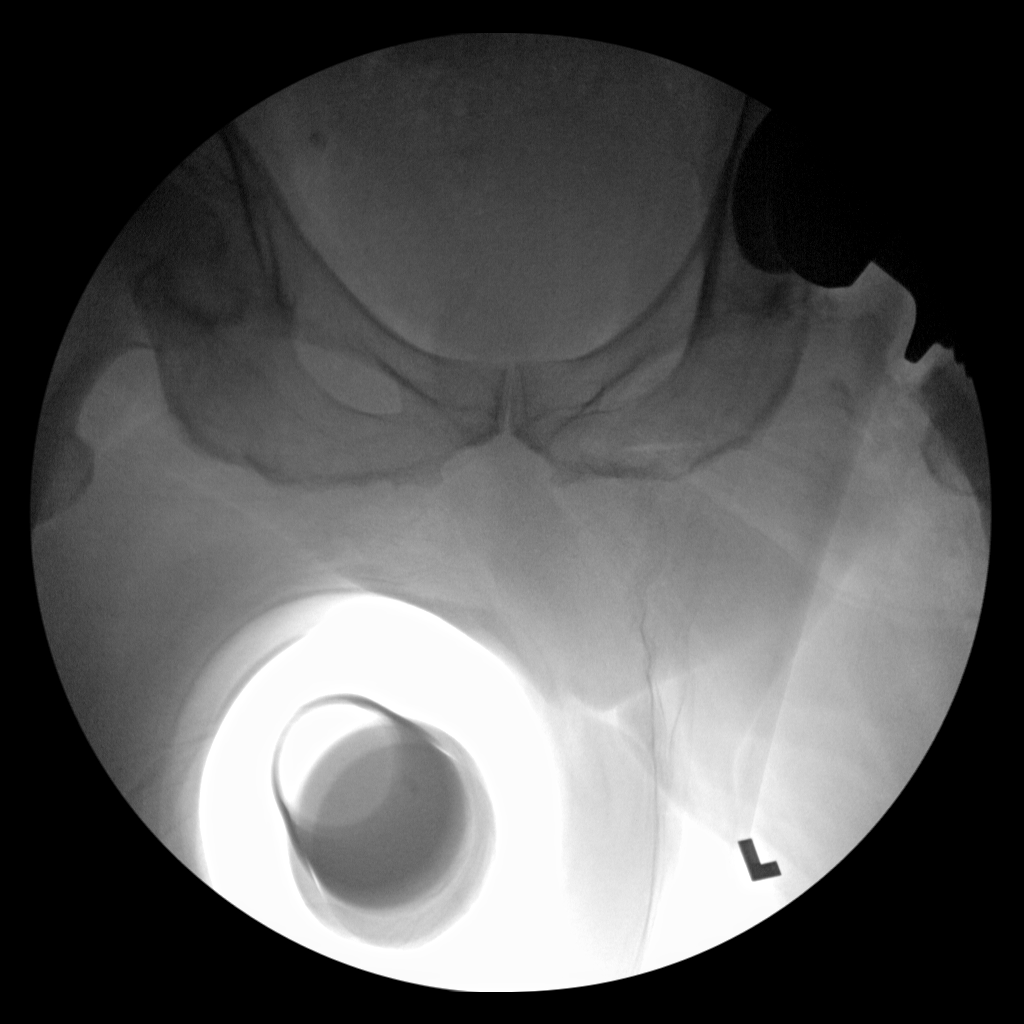

[2 of 2 positions shown; findings below may reference images not displayed]

FINDINGS: Components of LEFT hip prosthesis identified in expected positions.

No acute fracture dislocation.
IMPRESSION: LEFT hip prosthesis without acute complication.

## 2016-06-12 DIAGNOSIS — E119 Type 2 diabetes mellitus without complications: Secondary | ICD-10-CM | POA: Diagnosis not present

## 2016-06-12 DIAGNOSIS — E1165 Type 2 diabetes mellitus with hyperglycemia: Secondary | ICD-10-CM | POA: Diagnosis not present

## 2016-06-12 DIAGNOSIS — Z79899 Other long term (current) drug therapy: Secondary | ICD-10-CM | POA: Diagnosis not present

## 2016-06-12 DIAGNOSIS — Z794 Long term (current) use of insulin: Secondary | ICD-10-CM | POA: Diagnosis not present

## 2016-06-12 DIAGNOSIS — E784 Other hyperlipidemia: Secondary | ICD-10-CM | POA: Diagnosis not present

## 2016-06-14 DIAGNOSIS — E119 Type 2 diabetes mellitus without complications: Secondary | ICD-10-CM | POA: Diagnosis not present

## 2016-06-14 DIAGNOSIS — E784 Other hyperlipidemia: Secondary | ICD-10-CM | POA: Diagnosis not present

## 2016-06-14 DIAGNOSIS — I10 Essential (primary) hypertension: Secondary | ICD-10-CM | POA: Diagnosis not present

## 2016-06-14 DIAGNOSIS — Z794 Long term (current) use of insulin: Secondary | ICD-10-CM | POA: Diagnosis not present

## 2016-12-15 DIAGNOSIS — Z79899 Other long term (current) drug therapy: Secondary | ICD-10-CM | POA: Diagnosis not present

## 2016-12-15 DIAGNOSIS — I10 Essential (primary) hypertension: Secondary | ICD-10-CM | POA: Diagnosis not present

## 2016-12-15 DIAGNOSIS — E7800 Pure hypercholesterolemia, unspecified: Secondary | ICD-10-CM | POA: Diagnosis not present

## 2016-12-15 DIAGNOSIS — B351 Tinea unguium: Secondary | ICD-10-CM | POA: Diagnosis not present

## 2016-12-15 DIAGNOSIS — Z794 Long term (current) use of insulin: Secondary | ICD-10-CM | POA: Diagnosis not present

## 2016-12-15 DIAGNOSIS — E1140 Type 2 diabetes mellitus with diabetic neuropathy, unspecified: Secondary | ICD-10-CM | POA: Diagnosis not present

## 2016-12-15 DIAGNOSIS — E119 Type 2 diabetes mellitus without complications: Secondary | ICD-10-CM | POA: Diagnosis not present

## 2017-03-05 DIAGNOSIS — Z23 Encounter for immunization: Secondary | ICD-10-CM | POA: Diagnosis not present

## 2017-06-13 DIAGNOSIS — I10 Essential (primary) hypertension: Secondary | ICD-10-CM | POA: Diagnosis not present

## 2017-06-20 DIAGNOSIS — I10 Essential (primary) hypertension: Secondary | ICD-10-CM | POA: Diagnosis not present

## 2017-06-20 DIAGNOSIS — Z79899 Other long term (current) drug therapy: Secondary | ICD-10-CM | POA: Diagnosis not present

## 2017-06-20 DIAGNOSIS — E119 Type 2 diabetes mellitus without complications: Secondary | ICD-10-CM | POA: Diagnosis not present

## 2017-06-20 DIAGNOSIS — Z794 Long term (current) use of insulin: Secondary | ICD-10-CM | POA: Diagnosis not present

## 2017-06-20 DIAGNOSIS — Z136 Encounter for screening for cardiovascular disorders: Secondary | ICD-10-CM | POA: Diagnosis not present

## 2017-06-20 DIAGNOSIS — Z0001 Encounter for general adult medical examination with abnormal findings: Secondary | ICD-10-CM | POA: Diagnosis not present

## 2017-06-20 DIAGNOSIS — E7800 Pure hypercholesterolemia, unspecified: Secondary | ICD-10-CM | POA: Diagnosis not present

## 2017-06-21 ENCOUNTER — Other Ambulatory Visit: Payer: Self-pay | Admitting: Family Medicine

## 2017-06-21 DIAGNOSIS — Z136 Encounter for screening for cardiovascular disorders: Secondary | ICD-10-CM

## 2017-06-25 ENCOUNTER — Ambulatory Visit
Admission: RE | Admit: 2017-06-25 | Discharge: 2017-06-25 | Disposition: A | Discharge status: Home / Self Care | Payer: Medicare Other | Source: Ambulatory Visit | Attending: Family Medicine | Admitting: Family Medicine

## 2017-06-25 ENCOUNTER — Other Ambulatory Visit: Payer: Self-pay | Admitting: Family Medicine

## 2017-06-25 DIAGNOSIS — Z136 Encounter for screening for cardiovascular disorders: Secondary | ICD-10-CM | POA: Diagnosis not present

## 2017-06-25 DIAGNOSIS — Z87891 Personal history of nicotine dependence: Secondary | ICD-10-CM | POA: Diagnosis not present

## 2018-03-26 DEATH — deceased

## 2018-10-03 IMAGING — US US ABDOMINAL AORTA SCREENING AAA
1 series · 14 of 17 positions shown · non-contrast
Comparison: None.

CLINICAL DATA: 81-year-old male with a history of smoking

EXAM:
ULTRASOUND OF ABDOMINAL AORTA
TECHNIQUE: Ultrasound examination of the abdominal aorta was performed to
evaluate for abdominal aortic aneurysm.

[Series 1: us abdominal aorta screening aaa · 0.28mm/px · 14 of 17 slices shown]
[im 1/17]
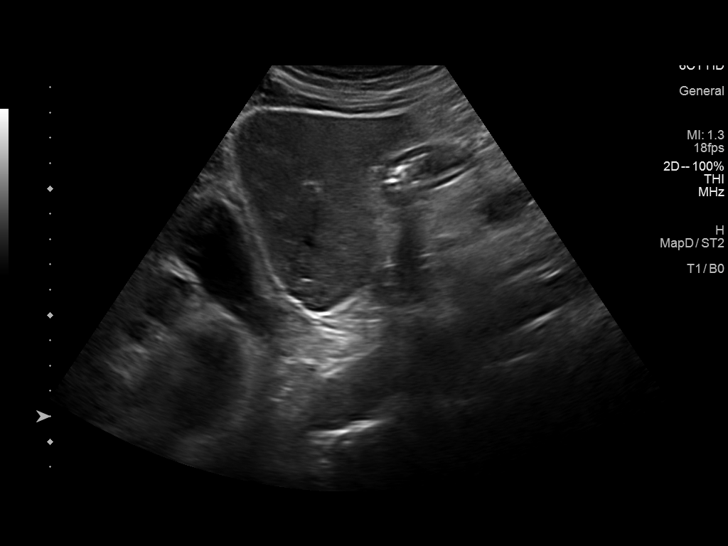
[im 2/17]
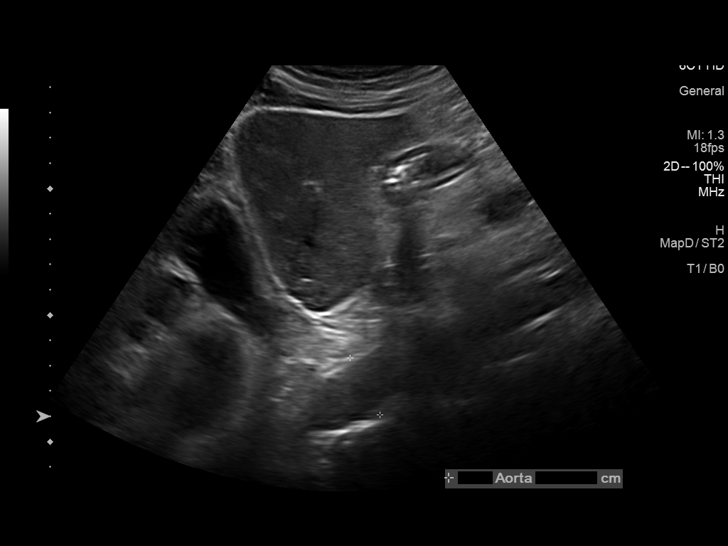
[im 4/17]
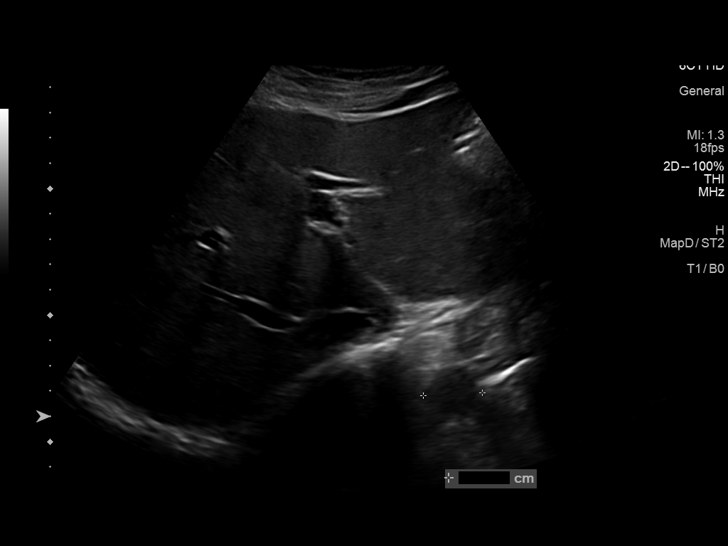
[im 5/17]
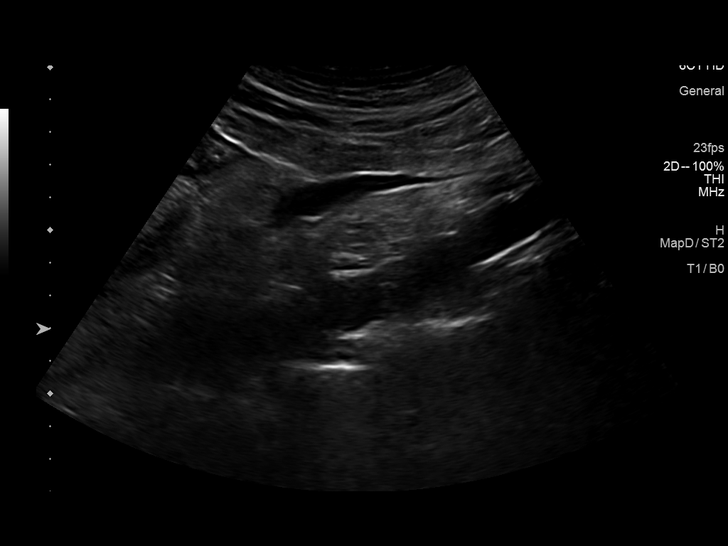
[im 6/17]
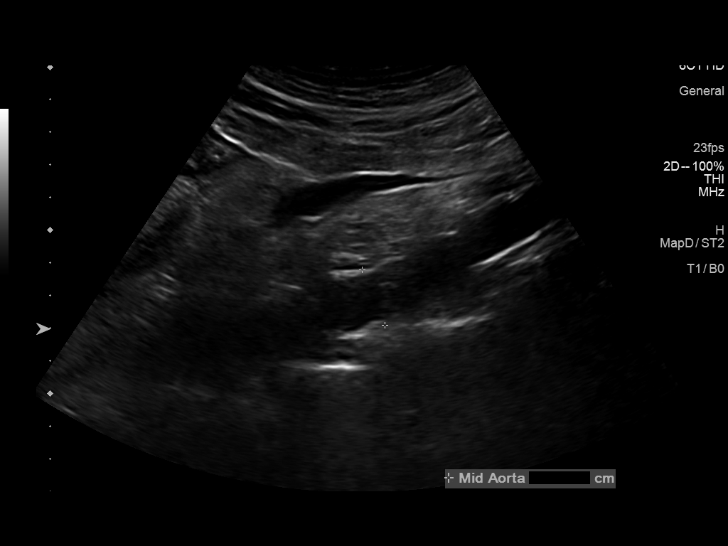
[im 7/17]
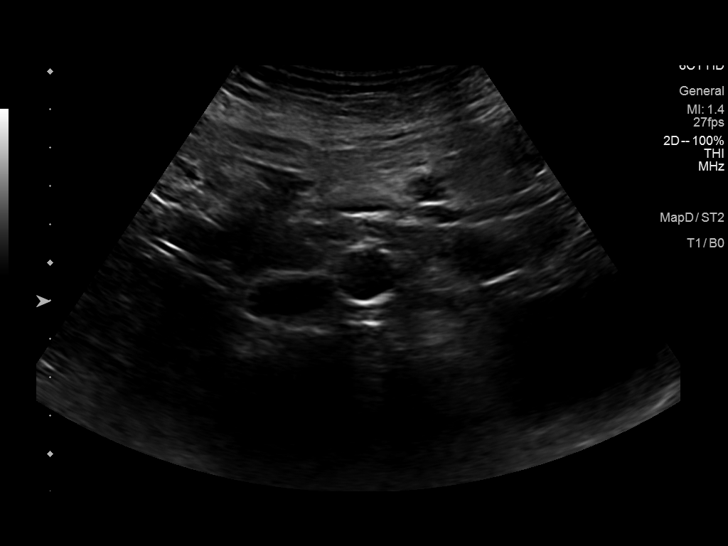
[im 8/17]
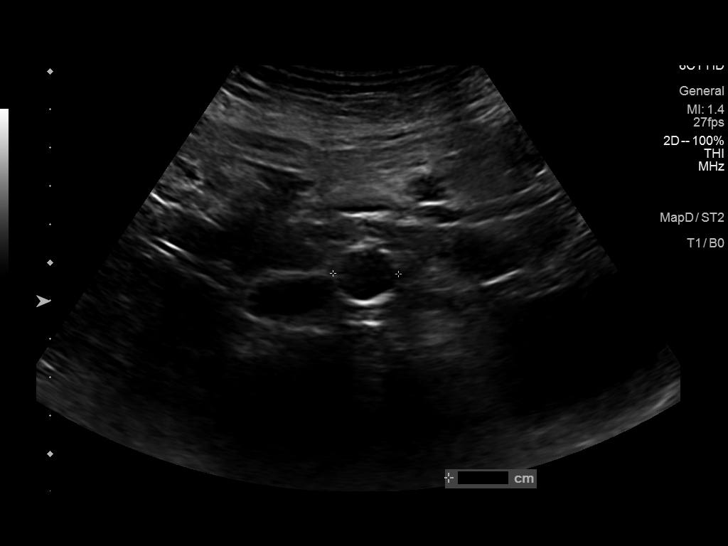
[im 10/17]
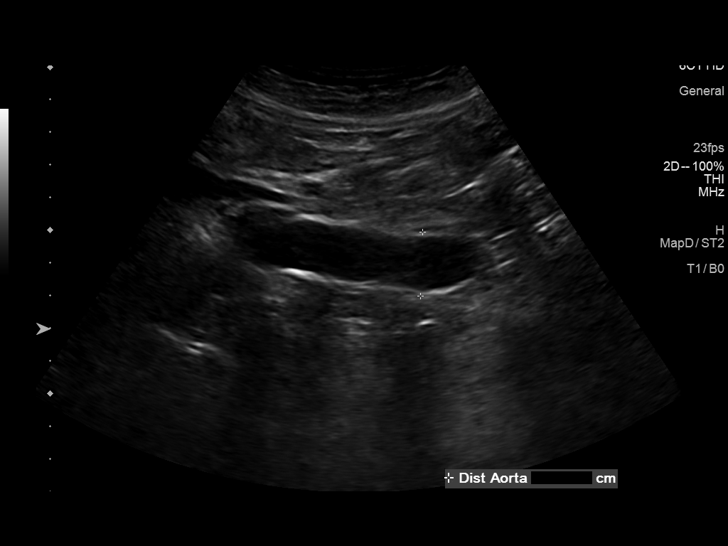
[im 11/17]
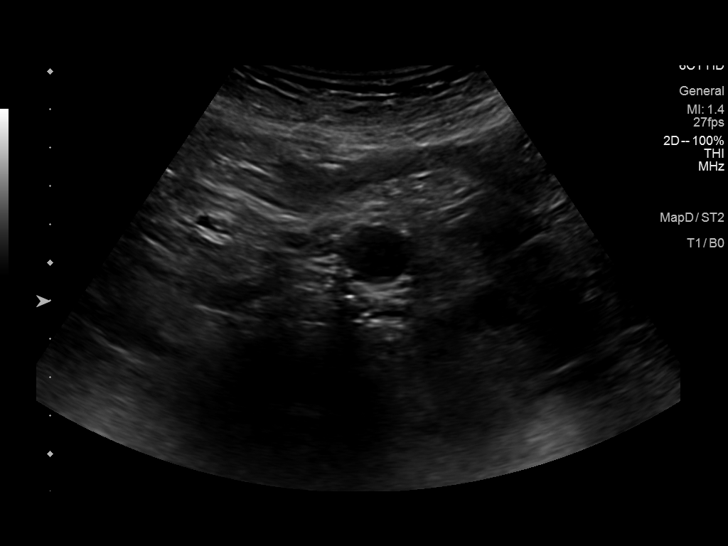
[im 12/17]
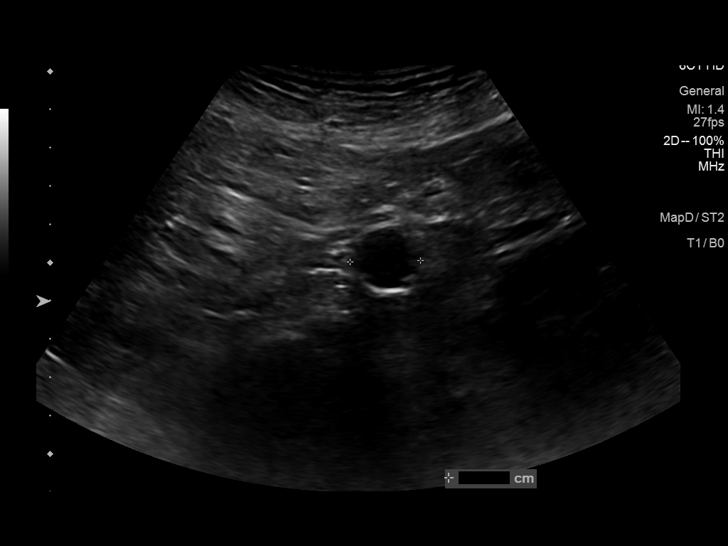
[im 13/17]
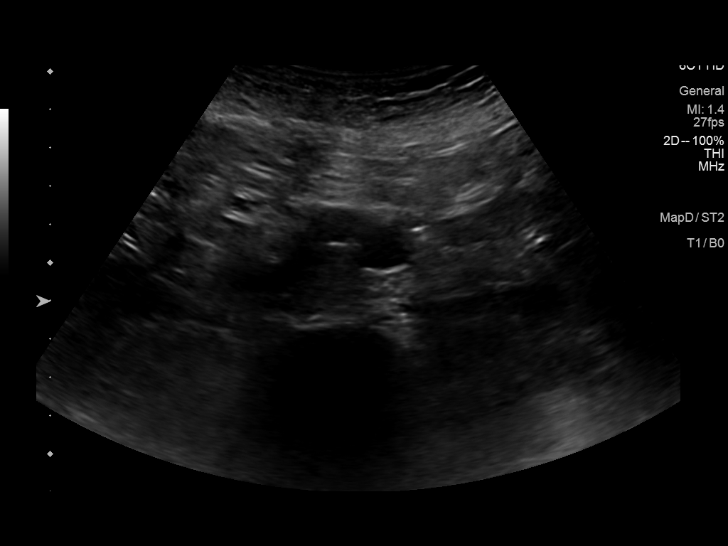
[im 14/17]
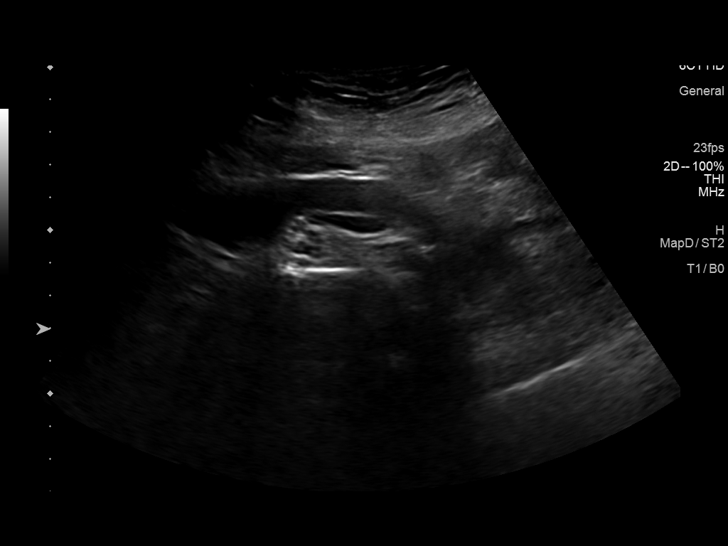
[im 16/17]
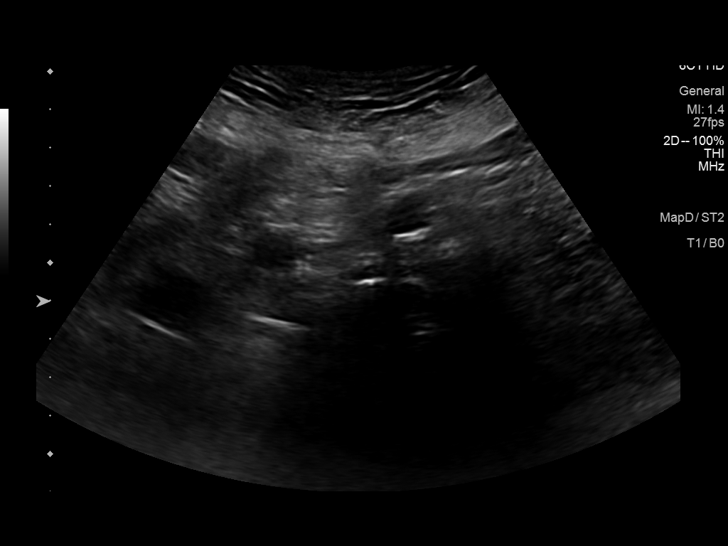
[im 17/17]
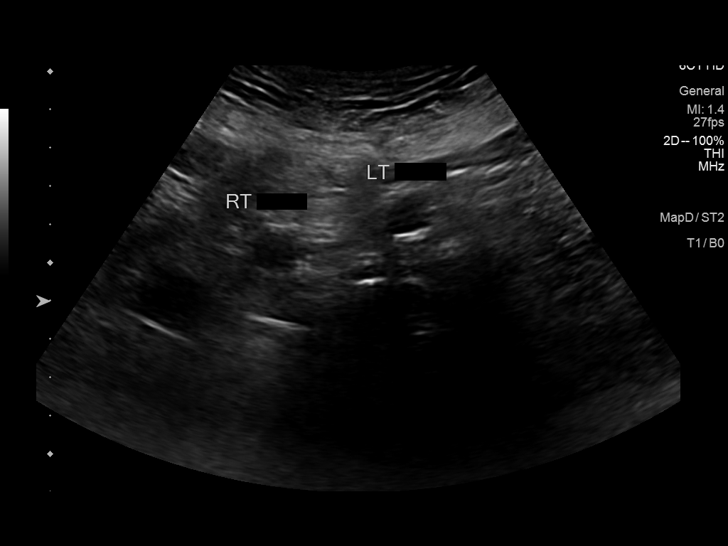

[14 of 17 positions shown; findings below may reference images not displayed]

FINDINGS: Abdominal aortic measurements as follows:

Proximal:  2.5 cm

Mid:  1.8 cm

Distal:  2.0 cm
IMPRESSION: Ectatic abdominal aorta at risk for aneurysm development. Recommend
followup by ultrasound in 5 years. This recommendation follows ACR
consensus guidelines: White Paper of the ACR Incidental Findings
Committee II on Vascular Findings. [HOSPITAL] 5132;
